# Patient Record
Sex: Female | Born: 1950 | Race: White | Hispanic: No | Marital: Married | Smoking: Never smoker
Health system: Southern US, Community
[De-identification: ages and names within clinical notes are randomized; demographics above are authoritative.]

## PROBLEM LIST (undated history)

## (undated) DIAGNOSIS — F32A Depression, unspecified: Secondary | ICD-10-CM

## (undated) DIAGNOSIS — R55 Syncope and collapse: Secondary | ICD-10-CM

## (undated) DIAGNOSIS — M1990 Unspecified osteoarthritis, unspecified site: Secondary | ICD-10-CM

## (undated) DIAGNOSIS — G35 Multiple sclerosis: Secondary | ICD-10-CM

## (undated) DIAGNOSIS — F329 Major depressive disorder, single episode, unspecified: Secondary | ICD-10-CM

## (undated) HISTORY — PX: BREAST LUMPECTOMY: SHX2

## (undated) HISTORY — PX: BREAST BIOPSY: SHX20

## (undated) HISTORY — PX: OTHER SURGICAL HISTORY: SHX169

## (undated) HISTORY — DX: Syncope and collapse: R55

## (undated) HISTORY — DX: Unspecified osteoarthritis, unspecified site: M19.90

## (undated) HISTORY — PX: BREAST EXCISIONAL BIOPSY: SUR124

## (undated) HISTORY — PX: BUNIONECTOMY: SHX129

## (undated) HISTORY — PX: TOTAL ABDOMINAL HYSTERECTOMY: SHX209

## (undated) HISTORY — DX: Depression, unspecified: F32.A

## (undated) HISTORY — DX: Major depressive disorder, single episode, unspecified: F32.9

## (undated) HISTORY — PX: REPLACEMENT TOTAL KNEE: SUR1224

## (undated) HISTORY — DX: Multiple sclerosis: G35

---

## 2016-11-21 DIAGNOSIS — G35 Multiple sclerosis: Secondary | ICD-10-CM | POA: Diagnosis not present

## 2016-11-21 DIAGNOSIS — Z1389 Encounter for screening for other disorder: Secondary | ICD-10-CM | POA: Diagnosis not present

## 2016-11-21 DIAGNOSIS — I499 Cardiac arrhythmia, unspecified: Secondary | ICD-10-CM | POA: Diagnosis not present

## 2016-11-21 DIAGNOSIS — I2510 Atherosclerotic heart disease of native coronary artery without angina pectoris: Secondary | ICD-10-CM | POA: Diagnosis not present

## 2016-11-21 DIAGNOSIS — F3289 Other specified depressive episodes: Secondary | ICD-10-CM | POA: Diagnosis not present

## 2016-11-21 DIAGNOSIS — M1990 Unspecified osteoarthritis, unspecified site: Secondary | ICD-10-CM | POA: Diagnosis not present

## 2016-11-21 DIAGNOSIS — F5104 Psychophysiologic insomnia: Secondary | ICD-10-CM | POA: Diagnosis not present

## 2016-11-21 DIAGNOSIS — Z6822 Body mass index (BMI) 22.0-22.9, adult: Secondary | ICD-10-CM | POA: Diagnosis not present

## 2016-11-21 DIAGNOSIS — M779 Enthesopathy, unspecified: Secondary | ICD-10-CM | POA: Diagnosis not present

## 2017-01-19 DIAGNOSIS — M7701 Medial epicondylitis, right elbow: Secondary | ICD-10-CM | POA: Diagnosis not present

## 2017-01-19 DIAGNOSIS — M7711 Lateral epicondylitis, right elbow: Secondary | ICD-10-CM | POA: Diagnosis not present

## 2017-01-19 DIAGNOSIS — M7702 Medial epicondylitis, left elbow: Secondary | ICD-10-CM | POA: Diagnosis not present

## 2017-03-16 DIAGNOSIS — M25561 Pain in right knee: Secondary | ICD-10-CM | POA: Diagnosis not present

## 2017-03-16 DIAGNOSIS — M25562 Pain in left knee: Secondary | ICD-10-CM | POA: Diagnosis not present

## 2017-03-16 DIAGNOSIS — Z6823 Body mass index (BMI) 23.0-23.9, adult: Secondary | ICD-10-CM | POA: Diagnosis not present

## 2017-03-16 DIAGNOSIS — M545 Low back pain: Secondary | ICD-10-CM | POA: Diagnosis not present

## 2017-03-31 DIAGNOSIS — M542 Cervicalgia: Secondary | ICD-10-CM | POA: Diagnosis not present

## 2017-03-31 DIAGNOSIS — M50121 Cervical disc disorder at C4-C5 level with radiculopathy: Secondary | ICD-10-CM | POA: Diagnosis not present

## 2017-03-31 DIAGNOSIS — M545 Low back pain: Secondary | ICD-10-CM | POA: Diagnosis not present

## 2017-03-31 DIAGNOSIS — M5031 Other cervical disc degeneration,  high cervical region: Secondary | ICD-10-CM | POA: Diagnosis not present

## 2017-04-04 DIAGNOSIS — H35342 Macular cyst, hole, or pseudohole, left eye: Secondary | ICD-10-CM | POA: Diagnosis not present

## 2017-04-04 DIAGNOSIS — H35372 Puckering of macula, left eye: Secondary | ICD-10-CM | POA: Diagnosis not present

## 2017-04-04 DIAGNOSIS — H33193 Other retinoschisis and retinal cysts, bilateral: Secondary | ICD-10-CM | POA: Diagnosis not present

## 2017-04-04 DIAGNOSIS — H3581 Retinal edema: Secondary | ICD-10-CM | POA: Diagnosis not present

## 2017-04-07 DIAGNOSIS — H524 Presbyopia: Secondary | ICD-10-CM | POA: Diagnosis not present

## 2017-04-07 DIAGNOSIS — H469 Unspecified optic neuritis: Secondary | ICD-10-CM | POA: Diagnosis not present

## 2017-04-07 DIAGNOSIS — H2513 Age-related nuclear cataract, bilateral: Secondary | ICD-10-CM | POA: Diagnosis not present

## 2017-04-07 DIAGNOSIS — H33199 Other retinoschisis and retinal cysts, unspecified eye: Secondary | ICD-10-CM | POA: Diagnosis not present

## 2017-04-07 DIAGNOSIS — H35372 Puckering of macula, left eye: Secondary | ICD-10-CM | POA: Diagnosis not present

## 2017-04-07 DIAGNOSIS — H35342 Macular cyst, hole, or pseudohole, left eye: Secondary | ICD-10-CM | POA: Diagnosis not present

## 2017-04-12 DIAGNOSIS — M5031 Other cervical disc degeneration,  high cervical region: Secondary | ICD-10-CM | POA: Diagnosis not present

## 2017-04-12 DIAGNOSIS — M5136 Other intervertebral disc degeneration, lumbar region: Secondary | ICD-10-CM | POA: Diagnosis not present

## 2017-04-18 DIAGNOSIS — B348 Other viral infections of unspecified site: Secondary | ICD-10-CM | POA: Diagnosis not present

## 2017-04-18 DIAGNOSIS — Z6823 Body mass index (BMI) 23.0-23.9, adult: Secondary | ICD-10-CM | POA: Diagnosis not present

## 2017-04-18 DIAGNOSIS — R05 Cough: Secondary | ICD-10-CM | POA: Diagnosis not present

## 2017-04-19 DIAGNOSIS — H2511 Age-related nuclear cataract, right eye: Secondary | ICD-10-CM | POA: Diagnosis not present

## 2017-04-19 DIAGNOSIS — H2512 Age-related nuclear cataract, left eye: Secondary | ICD-10-CM | POA: Diagnosis not present

## 2017-04-26 DIAGNOSIS — M5031 Other cervical disc degeneration,  high cervical region: Secondary | ICD-10-CM | POA: Diagnosis not present

## 2017-05-04 DIAGNOSIS — M7712 Lateral epicondylitis, left elbow: Secondary | ICD-10-CM | POA: Diagnosis not present

## 2017-05-23 HISTORY — PX: BUNIONECTOMY: SHX129

## 2017-05-24 DIAGNOSIS — E7849 Other hyperlipidemia: Secondary | ICD-10-CM | POA: Diagnosis not present

## 2017-05-24 DIAGNOSIS — I2510 Atherosclerotic heart disease of native coronary artery without angina pectoris: Secondary | ICD-10-CM | POA: Diagnosis not present

## 2017-05-24 DIAGNOSIS — Z1389 Encounter for screening for other disorder: Secondary | ICD-10-CM | POA: Diagnosis not present

## 2017-05-24 DIAGNOSIS — Z6824 Body mass index (BMI) 24.0-24.9, adult: Secondary | ICD-10-CM | POA: Diagnosis not present

## 2017-05-24 DIAGNOSIS — F3289 Other specified depressive episodes: Secondary | ICD-10-CM | POA: Diagnosis not present

## 2017-05-24 DIAGNOSIS — N183 Chronic kidney disease, stage 3 (moderate): Secondary | ICD-10-CM | POA: Diagnosis not present

## 2017-05-24 DIAGNOSIS — M7711 Lateral epicondylitis, right elbow: Secondary | ICD-10-CM | POA: Diagnosis not present

## 2017-05-24 DIAGNOSIS — F5104 Psychophysiologic insomnia: Secondary | ICD-10-CM | POA: Diagnosis not present

## 2017-05-24 DIAGNOSIS — G35 Multiple sclerosis: Secondary | ICD-10-CM | POA: Diagnosis not present

## 2017-05-30 DIAGNOSIS — H2512 Age-related nuclear cataract, left eye: Secondary | ICD-10-CM | POA: Diagnosis not present

## 2017-05-30 DIAGNOSIS — H25812 Combined forms of age-related cataract, left eye: Secondary | ICD-10-CM | POA: Diagnosis not present

## 2017-07-18 DIAGNOSIS — H35372 Puckering of macula, left eye: Secondary | ICD-10-CM | POA: Diagnosis not present

## 2017-07-18 DIAGNOSIS — H3581 Retinal edema: Secondary | ICD-10-CM | POA: Diagnosis not present

## 2017-07-18 DIAGNOSIS — H33193 Other retinoschisis and retinal cysts, bilateral: Secondary | ICD-10-CM | POA: Diagnosis not present

## 2017-07-18 DIAGNOSIS — H35342 Macular cyst, hole, or pseudohole, left eye: Secondary | ICD-10-CM | POA: Diagnosis not present

## 2017-07-31 DIAGNOSIS — H26492 Other secondary cataract, left eye: Secondary | ICD-10-CM | POA: Diagnosis not present

## 2017-07-31 DIAGNOSIS — H3581 Retinal edema: Secondary | ICD-10-CM | POA: Diagnosis not present

## 2017-07-31 DIAGNOSIS — H35372 Puckering of macula, left eye: Secondary | ICD-10-CM | POA: Diagnosis not present

## 2017-07-31 DIAGNOSIS — H35342 Macular cyst, hole, or pseudohole, left eye: Secondary | ICD-10-CM | POA: Diagnosis not present

## 2017-08-01 DIAGNOSIS — H35372 Puckering of macula, left eye: Secondary | ICD-10-CM | POA: Diagnosis not present

## 2017-08-07 DIAGNOSIS — H35372 Puckering of macula, left eye: Secondary | ICD-10-CM | POA: Diagnosis not present

## 2017-08-07 DIAGNOSIS — H3581 Retinal edema: Secondary | ICD-10-CM | POA: Diagnosis not present

## 2017-08-28 DIAGNOSIS — H35342 Macular cyst, hole, or pseudohole, left eye: Secondary | ICD-10-CM | POA: Diagnosis not present

## 2017-08-28 DIAGNOSIS — H35372 Puckering of macula, left eye: Secondary | ICD-10-CM | POA: Diagnosis not present

## 2017-09-26 DIAGNOSIS — H35372 Puckering of macula, left eye: Secondary | ICD-10-CM | POA: Diagnosis not present

## 2017-11-20 DIAGNOSIS — E559 Vitamin D deficiency, unspecified: Secondary | ICD-10-CM | POA: Diagnosis not present

## 2017-11-20 DIAGNOSIS — N183 Chronic kidney disease, stage 3 (moderate): Secondary | ICD-10-CM | POA: Diagnosis not present

## 2017-11-20 DIAGNOSIS — E7849 Other hyperlipidemia: Secondary | ICD-10-CM | POA: Diagnosis not present

## 2017-11-27 DIAGNOSIS — I2510 Atherosclerotic heart disease of native coronary artery without angina pectoris: Secondary | ICD-10-CM | POA: Diagnosis not present

## 2017-11-27 DIAGNOSIS — G35 Multiple sclerosis: Secondary | ICD-10-CM | POA: Diagnosis not present

## 2017-11-27 DIAGNOSIS — E559 Vitamin D deficiency, unspecified: Secondary | ICD-10-CM | POA: Diagnosis not present

## 2017-11-27 DIAGNOSIS — M1990 Unspecified osteoarthritis, unspecified site: Secondary | ICD-10-CM | POA: Diagnosis not present

## 2017-11-27 DIAGNOSIS — E7849 Other hyperlipidemia: Secondary | ICD-10-CM | POA: Diagnosis not present

## 2017-11-27 DIAGNOSIS — R61 Generalized hyperhidrosis: Secondary | ICD-10-CM | POA: Diagnosis not present

## 2017-11-27 DIAGNOSIS — F3289 Other specified depressive episodes: Secondary | ICD-10-CM | POA: Diagnosis not present

## 2017-11-27 DIAGNOSIS — N183 Chronic kidney disease, stage 3 (moderate): Secondary | ICD-10-CM | POA: Diagnosis not present

## 2017-11-27 DIAGNOSIS — Z0000 Encounter for general adult medical examination without abnormal findings: Secondary | ICD-10-CM | POA: Diagnosis not present

## 2017-11-29 DIAGNOSIS — H43811 Vitreous degeneration, right eye: Secondary | ICD-10-CM | POA: Diagnosis not present

## 2017-11-29 DIAGNOSIS — H35342 Macular cyst, hole, or pseudohole, left eye: Secondary | ICD-10-CM | POA: Diagnosis not present

## 2017-11-29 DIAGNOSIS — H3581 Retinal edema: Secondary | ICD-10-CM | POA: Diagnosis not present

## 2017-11-29 DIAGNOSIS — H33193 Other retinoschisis and retinal cysts, bilateral: Secondary | ICD-10-CM | POA: Diagnosis not present

## 2018-02-14 DIAGNOSIS — M79672 Pain in left foot: Secondary | ICD-10-CM | POA: Diagnosis not present

## 2018-02-14 DIAGNOSIS — Z6822 Body mass index (BMI) 22.0-22.9, adult: Secondary | ICD-10-CM | POA: Diagnosis not present

## 2018-03-07 DIAGNOSIS — H33193 Other retinoschisis and retinal cysts, bilateral: Secondary | ICD-10-CM | POA: Diagnosis not present

## 2018-03-07 DIAGNOSIS — H43811 Vitreous degeneration, right eye: Secondary | ICD-10-CM | POA: Diagnosis not present

## 2018-03-07 DIAGNOSIS — H3581 Retinal edema: Secondary | ICD-10-CM | POA: Diagnosis not present

## 2018-03-15 ENCOUNTER — Encounter: Payer: Self-pay | Admitting: Podiatry

## 2018-03-15 ENCOUNTER — Ambulatory Visit (INDEPENDENT_AMBULATORY_CARE_PROVIDER_SITE_OTHER): Payer: Medicare HMO

## 2018-03-15 ENCOUNTER — Ambulatory Visit: Payer: Medicare HMO | Admitting: Podiatry

## 2018-03-15 DIAGNOSIS — M2011 Hallux valgus (acquired), right foot: Secondary | ICD-10-CM

## 2018-03-15 DIAGNOSIS — M205X2 Other deformities of toe(s) (acquired), left foot: Secondary | ICD-10-CM

## 2018-03-15 DIAGNOSIS — M7752 Other enthesopathy of left foot: Secondary | ICD-10-CM

## 2018-03-15 DIAGNOSIS — M2012 Hallux valgus (acquired), left foot: Secondary | ICD-10-CM

## 2018-03-15 DIAGNOSIS — M2010 Hallux valgus (acquired), unspecified foot: Secondary | ICD-10-CM

## 2018-03-15 NOTE — Progress Notes (Signed)
Subjective:    Patient ID: Jenan Ellegood, female    DOB: January 12, 1951, 67 y.o.   MRN: [id number]  HPI 67 year old female presents the office today for concerns of pain in the left big toe joint which is been ongoing for the last several weeks.  She states that she is interested in 3 months ago in the last 6 weeks she is having increased pain and swelling to the big toe joint.  She states that it feels the same as it did previously on the right foot before she had surgery.  She had surgery about 7 years ago she had a hemiarthroplasty performed to the first MPJ.  She states that she has pain on daily basis she is tried changing shoes come off with a significant improvement.   Review of Systems  All other systems reviewed and are negative.  History reviewed. No pertinent past medical history.  History reviewed. No pertinent surgical history.   Current Outpatient Medications:  .  desvenlafaxine (PRISTIQ) 50 MG 24 hr tablet, TAKE ONE TABLE BY MOUTH DAILY, Disp: , Rfl: 5 .  estradiol (ESTRACE) 0.5 MG tablet, Take 0.5 mg by mouth daily., Disp: , Rfl: 2 .  MYRBETRIQ 50 MG TB24 tablet, Take 50 mg by mouth at bedtime., Disp: , Rfl: 2 .  Vitamin D, Ergocalciferol, (DRISDOL) 50000 units CAPS capsule, TAKE ONE (1) CAPSULE BY MOUTH ONCE (1X) A WEEK, Disp: , Rfl: 2  No Known Allergies       Objective:   Physical Exam  General: AAO x3, NAD  Dermatological: Skin is warm, dry and supple bilateral. Nails x 10 are well manicured; remaining integument appears unremarkable at this time. There are no open sores, no preulcerative lesions, no rash or signs of infection present.  Vascular: Dorsalis Pedis artery and Posterior Tibial artery pedal pulses are 2/4 bilateral with immedate capillary fill time. Pedal hair growth present. No varicosities and no lower extremity edema present bilateral. There is no pain with calf compression, swelling, warmth, erythema.   Neruologic: Grossly intact via light touch  bilateral. Protective threshold with Semmes Wienstein monofilament intact to all pedal sites bilateral.   Musculoskeletal: There is discomfort with MPJ range of motion of the first MPJ on the left side.  Minimal discomfort of the right foot on the first MPJ.  She states that the pain on the first MPJ in the left foot is painful like it was prior to surgery in the right side.  There is minimal edema of the first MPJ and there is no erythema increased warmth.  No other areas of tenderness identified.  Muscular strength 5/5 in all groups tested bilateral.  Gait: Unassisted, Nonantalgic.      Assessment & Plan:  67 year old female capsulitis, hallux limitus left first MPJ -Treatment options discussed including all alternatives, risks, and complications -Etiology of symptoms were discussed -X-rays were obtained and reviewed with the patient.  Mild arthritic changes present the first MPJ on the left side.  Previous hemiarthroplasty performed on the right first MPJ.  No evidence of acute fracture. -We long discussion today in regards to treatment options.  I discussed steroid injection, anti-inflammatories, shoe modifications, orthotics.  After discussed with conservative treatment she states that she tried all this previously in the right side before she did have surgery and she wants to go him proceed with surgery in the left side.  We discussed surgery was postoperative course this is not a guarantee resolution of symptoms.  She states that she  does not want a fusion performed.  We discussed Keller implant with hemiarthroplasty versus a Cartiva implant.  Discussed will make an intraoperative decision.  However she did well with any implant she is done well the last 7 years however she started to have pain on that side on the right as well.  Discussed after she has surgery in the left foot we will do an orthotic both of shoes to help offload the first MPJs. -We will plan for left foot Keller arthroplasty  with implant versus cheilectomy with Cartiva -The incision placement as well as the postoperative course was discussed with the patient. I discussed risks of the surgery which include, but not limited to, infection, bleeding, pain, swelling, need for further surgery, delayed or nonhealing, painful or ugly scar, numbness or sensation changes, over/under correction, recurrence, transfer lesions, further deformity, hardware failure, DVT/PE, loss of toe/foot. Patient understands these risks and wishes to proceed with surgery. The surgical consent was reviewed with the patient all 3 pages were signed. No promises or guarantees were given to the outcome of the procedure. All questions were answered to the best of my ability. Before the surgery the patient was encouraged to call the office if there is any further questions. The surgery will be performed at the [facility] [place] on an outpatient basis.  Vivi Barrack DPM

## 2018-03-15 NOTE — Patient Instructions (Signed)

## 2018-04-25 ENCOUNTER — Encounter: Payer: Self-pay | Admitting: Podiatry

## 2018-04-25 ENCOUNTER — Other Ambulatory Visit: Payer: Self-pay | Admitting: Podiatry

## 2018-04-25 DIAGNOSIS — M21612 Bunion of left foot: Secondary | ICD-10-CM | POA: Diagnosis not present

## 2018-04-25 DIAGNOSIS — M205X2 Other deformities of toe(s) (acquired), left foot: Secondary | ICD-10-CM | POA: Diagnosis not present

## 2018-04-25 DIAGNOSIS — M2012 Hallux valgus (acquired), left foot: Secondary | ICD-10-CM | POA: Diagnosis not present

## 2018-04-25 DIAGNOSIS — E7800 Pure hypercholesterolemia, unspecified: Secondary | ICD-10-CM | POA: Diagnosis not present

## 2018-04-25 DIAGNOSIS — M25572 Pain in left ankle and joints of left foot: Secondary | ICD-10-CM | POA: Diagnosis not present

## 2018-04-25 MED ORDER — OXYCODONE-ACETAMINOPHEN 5-325 MG PO TABS: 1.0000 | ORAL_TABLET | Freq: Four times a day (QID) | ORAL | 0 refills | Status: DC | PRN

## 2018-04-25 MED ORDER — PROMETHAZINE HCL 25 MG PO TABS: 25.0000 mg | ORAL_TABLET | Freq: Three times a day (TID) | ORAL | 0 refills | Status: DC | PRN

## 2018-04-25 MED ORDER — CEPHALEXIN 500 MG PO CAPS: 500.0000 mg | ORAL_CAPSULE | Freq: Three times a day (TID) | ORAL | 0 refills | Status: DC

## 2018-04-25 NOTE — Progress Notes (Signed)
Pre-operative Note  Patient presents to the [facility] today for surgical intervention of the left foot for keller bunionectomy with implant. The surgical consent was reviewed with the patient and we discussed the procedure as well as the postoperative course. I again discussed all alternatives, risks, complications. I answered all of their questions to the best of my ability and they wish to proceed with surgery. No promises or guarantees were given as to the outcome of the surgery.   The surgical consent was signed.   Patient is NPO since midnight.  The patient does not have have a history of blood clots or bleeding disorders.   Postop medications sent to her pharmacy.   No further questions.   Ovid Curd, DPM [facility]

## 2018-04-26 ENCOUNTER — Telehealth: Payer: Self-pay | Admitting: *Deleted

## 2018-04-26 NOTE — Telephone Encounter (Signed)
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 Misty Stanley

## 2018-04-30 ENCOUNTER — Ambulatory Visit (INDEPENDENT_AMBULATORY_CARE_PROVIDER_SITE_OTHER): Payer: Medicare HMO

## 2018-04-30 ENCOUNTER — Ambulatory Visit (INDEPENDENT_AMBULATORY_CARE_PROVIDER_SITE_OTHER): Payer: Medicare HMO | Admitting: Podiatry

## 2018-04-30 VITALS — Temp 98.0°F

## 2018-04-30 DIAGNOSIS — Z09 Encounter for follow-up examination after completed treatment for conditions other than malignant neoplasm: Secondary | ICD-10-CM

## 2018-04-30 DIAGNOSIS — M7752 Other enthesopathy of left foot: Secondary | ICD-10-CM | POA: Diagnosis not present

## 2018-04-30 DIAGNOSIS — M2010 Hallux valgus (acquired), unspecified foot: Secondary | ICD-10-CM

## 2018-04-30 DIAGNOSIS — M205X2 Other deformities of toe(s) (acquired), left foot: Secondary | ICD-10-CM

## 2018-05-03 NOTE — Progress Notes (Signed)
Subjective: Kelli Pratt is a 67 y.o. is seen today in office s/p left foot Keller bunionectomy with implant preformed on 04/25/2018.  She states that she is doing well she is not given any pain medication.  She has been doing a lot of volunteer work.  She has not taken any pain medicine.  Denies any systemic complaints such as fevers, chills, nausea, vomiting. No calf pain, chest pain, shortness of breath.   Objective: General: No acute distress, AAOx3  DP/PT pulses palpable 2/4, CRT < 3 sec to all digits.  Protective sensation intact. Motor function intact.  LEFT foot: Incision is well coapted without any evidence of dehiscence and sutures are intact. There is no surrounding erythema, ascending cellulitis, fluctuance, crepitus, malodor, drainage/purulence.  There is a blister present to the distal portion of the incision laterally on the toe.  The drain is only clear, blood fluid was expressed but there is no purulence.  There is mild edema around the surgical site. There is no pain along the surgical site.  There is no pain with MPJ range of motion No other areas of tenderness to bilateral lower extremities.  No other open lesions or pre-ulcerative lesions.  No pain with calf compression, swelling, warmth, erythema.   Assessment and Plan:  Status post left foot surgery, doing well with no complications   -Treatment options discussed including all alternatives, risks, and complications -X-rays were obtained again.  Status post Lorenz Coaster poignantly with implant.  No evidence of acute fracture. -Antibiotic ointment was applied followed by dry sterile dressing as directed during the blister.  I cleaned the skin with alcohol. -Continue cam boot at all times and limit activity.  Finish course of antibiotics. -Ice/elevation -Pain medication as needed. -Monitor for any clinical signs or symptoms of infection and DVT/PE and directed to call the office immediately should any occur or go to the  ER. -Follow-up 10 days or sooner if any problems arise. In the meantime, encouraged to call the office with any questions, concerns, change in symptoms.   Ovid Curd, DPM

## 2018-05-10 ENCOUNTER — Encounter: Payer: Medicare HMO | Admitting: Podiatry

## 2018-05-11 ENCOUNTER — Ambulatory Visit (INDEPENDENT_AMBULATORY_CARE_PROVIDER_SITE_OTHER): Payer: Medicare HMO | Admitting: Podiatry

## 2018-05-11 DIAGNOSIS — M205X2 Other deformities of toe(s) (acquired), left foot: Secondary | ICD-10-CM

## 2018-05-11 DIAGNOSIS — Z09 Encounter for follow-up examination after completed treatment for conditions other than malignant neoplasm: Secondary | ICD-10-CM

## 2018-05-13 DIAGNOSIS — M205X2 Other deformities of toe(s) (acquired), left foot: Secondary | ICD-10-CM | POA: Insufficient documentation

## 2018-05-13 NOTE — Progress Notes (Signed)
Subjective: Kelli Pratt is a 67 y.o. is seen today in office s/p left foot Keller bunionectomy with implant preformed on 04/25/2018.  Overall she states that she is doing well with minimal discomfort however she has got some throbbing at times and this all started after she did hit her toe.  She also states that she was at the sutures removed today and if we do not remove them she is going take them out herself.  She is remained in the surgical boot. Denies any systemic complaints such as fevers, chills, nausea, vomiting. No calf pain, chest pain, shortness of breath.   Objective: General: No acute distress, AAOx3  DP/PT pulses palpable 2/4, CRT < 3 sec to all digits.  Protective sensation intact. Motor function intact.  LEFT foot: Incision is well coapted without any evidence of dehiscence and sutures are intact.  The area that she had the blister on previously has scabbed over.  There is minimal edema to the area there is no erythema or increase in warmth there is no drainage or pus identified in the incision there is no clinical signs of infection.  She has great range of motion of her first MPJ there is no pain to palpitation or restriction with MPJ range of motion. No other areas of tenderness to bilateral lower extremities.  No other open lesions or pre-ulcerative lesions.  No pain with calf compression, swelling, warmth, erythema.   Assessment and Plan:  Status post left foot surgery, doing well with no complications   -Treatment options discussed including all alternatives, risks, and complications -Sutures removed without complications and Steri-Strips were applied for reinforcement.  I want her to hold off on getting the area wet for couple of days and then she starts to shower apply antibiotic ointment and a bandage.  Monitor if there is any issues of the incision not to get it wet. -Continue cam boot for now.  I want her to continue with some range of motion exercises for the first  MPJ. -Monitor for any clinical signs or symptoms of infection and DVT/PE and directed to call the office immediately should any occur or go to the ER. -Follow-up in 2 weeks or sooner if any problems arise. In the meantime, encouraged to call the office with any questions, concerns, change in symptoms.   Ovid Curd, DPM

## 2018-05-25 ENCOUNTER — Ambulatory Visit (INDEPENDENT_AMBULATORY_CARE_PROVIDER_SITE_OTHER): Payer: Medicare HMO | Admitting: Podiatry

## 2018-05-25 ENCOUNTER — Encounter: Payer: Self-pay | Admitting: Podiatry

## 2018-05-25 ENCOUNTER — Ambulatory Visit (INDEPENDENT_AMBULATORY_CARE_PROVIDER_SITE_OTHER): Payer: Medicare HMO

## 2018-05-25 DIAGNOSIS — Z9889 Other specified postprocedural states: Secondary | ICD-10-CM

## 2018-05-25 DIAGNOSIS — M205X2 Other deformities of toe(s) (acquired), left foot: Secondary | ICD-10-CM | POA: Diagnosis not present

## 2018-05-30 DIAGNOSIS — G35 Multiple sclerosis: Secondary | ICD-10-CM | POA: Diagnosis not present

## 2018-05-30 DIAGNOSIS — R110 Nausea: Secondary | ICD-10-CM | POA: Diagnosis not present

## 2018-05-30 DIAGNOSIS — E559 Vitamin D deficiency, unspecified: Secondary | ICD-10-CM | POA: Diagnosis not present

## 2018-05-30 DIAGNOSIS — H3530 Unspecified macular degeneration: Secondary | ICD-10-CM | POA: Diagnosis not present

## 2018-05-30 DIAGNOSIS — I2510 Atherosclerotic heart disease of native coronary artery without angina pectoris: Secondary | ICD-10-CM | POA: Diagnosis not present

## 2018-05-30 DIAGNOSIS — R251 Tremor, unspecified: Secondary | ICD-10-CM | POA: Diagnosis not present

## 2018-05-30 DIAGNOSIS — N183 Chronic kidney disease, stage 3 (moderate): Secondary | ICD-10-CM | POA: Diagnosis not present

## 2018-05-30 DIAGNOSIS — E7849 Other hyperlipidemia: Secondary | ICD-10-CM | POA: Diagnosis not present

## 2018-05-30 DIAGNOSIS — F329 Major depressive disorder, single episode, unspecified: Secondary | ICD-10-CM | POA: Diagnosis not present

## 2018-05-30 NOTE — Progress Notes (Signed)
Subjective: Kelli Pratt is a 68 y.o. is seen today in office s/p left foot Keller bunionectomy with implant preformed on 04/25/2018.  She states that she is doing well.  She has been in the surgical shoe.  She has been doing some range of motion exercise this and she is able to do this but any issues.  She actually forgot to wear the surgical shoe when she stood up on her tiptoes and bent it quite a bit but she had no pain when doing this.  She is ready get back into a shoe.  She has no other concerns today. Denies any systemic complaints such as fevers, chills, nausea, vomiting. No calf pain, chest pain, shortness of breath.   Objective: General: No acute distress, AAOx3  DP/PT pulses palpable 2/4, CRT < 3 sec to all digits.  Protective sensation intact. Motor function intact.  LEFT foot: Incision is well coapted without any evidence of dehiscence and a scar is formed.  There is minimal edema to the area there is no erythema or increase in warmth.  Incision appears to be healing well with any signs of dehiscence or infection.  There is no pain with MPJ range of motion. No other areas of tenderness to bilateral lower extremities.  No other open lesions or pre-ulcerative lesions.  No pain with calf compression, swelling, warmth, erythema.   Assessment and Plan:  Status post left foot surgery, doing well with no complications   -Treatment options discussed including all alternatives, risks, and complications -X-rays were obtained reviewed.  Hardware intact without any loosening. -Order to start to transition slowly back into regular shoe.  Continue with compression anklet was dispensed today.  Continue ice elevate as well.  Continue range of motion exercises. -Monitor for any clinical signs or symptoms of infection and directed to call the office immediately should any occur or go to the ER.  Return in about 3 weeks (around 06/15/2018).  Vivi Barrack DPM

## 2018-06-06 DIAGNOSIS — H33193 Other retinoschisis and retinal cysts, bilateral: Secondary | ICD-10-CM | POA: Diagnosis not present

## 2018-06-06 DIAGNOSIS — H3581 Retinal edema: Secondary | ICD-10-CM | POA: Diagnosis not present

## 2018-06-06 DIAGNOSIS — H43811 Vitreous degeneration, right eye: Secondary | ICD-10-CM | POA: Diagnosis not present

## 2018-06-15 ENCOUNTER — Ambulatory Visit (INDEPENDENT_AMBULATORY_CARE_PROVIDER_SITE_OTHER): Payer: Medicare HMO | Admitting: Podiatry

## 2018-06-15 ENCOUNTER — Encounter: Payer: Self-pay | Admitting: Podiatry

## 2018-06-15 DIAGNOSIS — Z9889 Other specified postprocedural states: Secondary | ICD-10-CM

## 2018-06-15 DIAGNOSIS — B351 Tinea unguium: Secondary | ICD-10-CM

## 2018-06-15 DIAGNOSIS — M205X2 Other deformities of toe(s) (acquired), left foot: Secondary | ICD-10-CM

## 2018-06-19 DIAGNOSIS — B351 Tinea unguium: Secondary | ICD-10-CM | POA: Insufficient documentation

## 2018-06-19 NOTE — Progress Notes (Signed)
Subjective: Kelli Pratt is a 68 y.o. is seen today in office s/p left foot Keller bunionectomy with implant preformed on 04/25/2018. Sh has bene wearing a regular shoe. She states that she is doing well with only minimal occasional pain. She has continued with ROM exercises. The incision is well healed. She is interested in laser for nail fungus. Denies any systemic complaints such as fevers, chills, nausea, vomiting. No calf pain, chest pain, shortness of breath.   Objective: General: No acute distress, AAOx3  DP/PT pulses palpable 2/4, CRT < 3 sec to all digits.  Protective sensation intact. Motor function intact.  LEFT foot: Incision is well coapted without any evidence of dehiscence and a scar is formed.  There is trace edema to the area there is no erythema or increase in warmth.  The incision is well healed. I am not able to elicit any tenderness to the surgical site or with ROM of the 1st MTPJ. She has great ROM of the 1st MTPJ without any crepitance. No other areas of tenderness to bilateral lower extremities.  The hallux toenail is thick and discolored without any pain, drainage, signs of infection.  No other open lesions or pre-ulcerative lesions.  No pain with calf compression, swelling, warmth, erythema.   Assessment and Plan:  Status post left foot surgery, doing well with no complications; onychomycosis.   -Treatment options discussed including all alternatives, risks, and complications -X-rays were obtained reviewed.  Hardware intact without any loosening. No evidence of acute fracture.  -She is doing well. Continue with ROM exercises and regular shoes. She gets some intermittent swelling. Continue with compression anklet -Discussed laser for nail fungus. She will consider it. Discussed this is not a guarantee and would recommend adding at least a topical in combination with laser.    Vivi Barrack DPM

## 2018-06-27 ENCOUNTER — Ambulatory Visit: Payer: Medicare HMO | Admitting: Internal Medicine

## 2018-06-27 ENCOUNTER — Encounter: Payer: Self-pay | Admitting: Internal Medicine

## 2018-06-27 VITALS — BP 126/84 | HR 90 | Ht 66.0 in | Wt 142.0 lb

## 2018-06-27 DIAGNOSIS — R072 Precordial pain: Secondary | ICD-10-CM

## 2018-06-27 DIAGNOSIS — E782 Mixed hyperlipidemia: Secondary | ICD-10-CM

## 2018-06-27 DIAGNOSIS — R0602 Shortness of breath: Secondary | ICD-10-CM

## 2018-06-27 DIAGNOSIS — R079 Chest pain, unspecified: Secondary | ICD-10-CM | POA: Diagnosis not present

## 2018-06-27 MED ORDER — ASPIRIN EC 81 MG PO TBEC: 81.0000 mg | DELAYED_RELEASE_TABLET | Freq: Every day | ORAL | Status: AC

## 2018-06-27 MED ORDER — METOPROLOL TARTRATE 50 MG PO TABS: ORAL_TABLET | ORAL | 0 refills | Status: DC

## 2018-06-27 NOTE — Patient Instructions (Addendum)
Medication Instructions:  Your physician has recommended you make the following change in your medication:   START Aspirin 81mg  daily  REDUCE your Ibuprofen use  If you need a refill on your cardiac medications before your next appointment, please call your pharmacy.   Lab work: Your physician recommends that you return for lab work a couple of days prior to your Cor CT  If you have labs (blood work) drawn today and your tests are completely normal, you will receive your results only by: Marland Kitchen MyChart Message (if you have MyChart) OR . A paper copy in the mail If you have any lab test that is abnormal or we need to change your treatment, we will call you to review the results.  Testing/Procedures: Your physician has requested that you have cardiac CT. Cardiac computed tomography (CT) is a painless test that uses an x-ray machine to take clear, detailed pictures of your heart. For further information please visit [email or web address] Please follow instruction sheet as given.     Follow-Up: At [facility], you and your health needs are our priority.  As part of our continuing mission to provide you with exceptional heart care, we have created designated Provider Care Teams.  These Care Teams include your primary Cardiologist (physician) and Advanced Practice Providers (APPs -  Physician Assistants and Nurse Practitioners) who all work together to provide you with the care you need, when you need it.  You will need a follow up appointment after your Cardiac CT      Any Other Special Instructions Will Be Listed Below (If Applicable).   Please arrive at the [facility] main entrance of [facility] at TBD  (30-45 minutes prior to test start time)  [facility] [address] [place], [place] [address] [phone]  Proceed to the [facility] Radiology Department (First Floor).  Please follow these instructions carefully (unless otherwise directed):    On  the Night Before the Test: . Be sure to Drink plenty of water. . Do not consume any caffeinated/decaffeinated beverages or chocolate 12 hours prior to your test. . Do not take any antihistamines 12 hours prior to your test.  On the Day of the Test: . Drink plenty of water. Do not drink any water within one hour of the test. . Do not eat any food 4 hours prior to the test. . You may take your regular medications prior to the test.  . Take metoprolol (Lopressor) two hours prior to test.       After the Test: . Drink plenty of water. . After receiving IV contrast, you may experience a mild flushed feeling. This is normal. . On occasion, you may experience a mild rash up to 24 hours after the test. This is not dangerous. If this occurs, you can take Benadryl 25 mg and increase your fluid intake. . If you experience trouble breathing, this can be serious. If it is severe call 911 IMMEDIATELY. If it is mild, please call our office. . If you take any of these medications: Glipizide/Metformin, Avandament, Glucavance, please do not take 48 hours after completing test.

## 2018-06-27 NOTE — Progress Notes (Addendum)
LIPID CLINIC CONSULT NOTE  Chief Complaint:  Chest pain, dyspnea, elevated cholesterol  Primary Care Physician: Reynold Bowen, MD  Primary Cardiologist:  No primary care provider on file.  HPI:  D Kelli Pratt is a 68 y.o. female who is being seen today for the evaluation of chest pain, dyspnea, and elevated cholesterol at the request of Reynold Bowen, MD. This is a pleasant 68 year old female who is kindly referred by Dr. Milas Gain for evaluation of chest pain and progressive dyspnea.  In addition she has elevated cholesterol and is disinterested in statins due to her history of MS.  She reports she has had progressive dyspnea and chest pain over the past several weeks to months.  Her symptoms were significant this past Sunday and she almost went to the emergency department.  Finally her symptoms resolved.  She describes it as a central chest pressure with shortness of breath even with minimal exertion such as getting out of bed and going to the bathroom.  There is a strong family history of heart disease in her father who had an MI in his 63s or 65s and mother who had elevated cholesterol.  Personally she has a high cholesterol as well.  Her total cholesterol as of January showed total cholesterol 296, triglycerides 67, HDL 74, however LDL was 209.  Again she has not been on statin therapy before.  She does have MS but is not on treatment due to cost issues.  Other medical problems include osteoarthritis, depression and an episode of exercise related bradycardia and syncope approximately 10 years ago when she was in [place] visiting.  Apparently she had a heart catheterization which showed about a 30% blockage but no significant other findings.  She has had no further episodes like that.  PMHx:  Past Medical History:  Diagnosis Date  . Depression   . MS (multiple sclerosis) ([place])   . OA (osteoarthritis)   . Syncope and collapse     Past Surgical History:  Procedure Laterality Date  .  BUNIONECTOMY    . cataracts      FAMHx:  Family History  Problem Relation Age of Onset  . Multiple myeloma Mother   . Heart attack Father   . Dementia Father     SOCHx:   reports that she has never smoked. She has never used smokeless tobacco. She reports previous alcohol use. No history on file for drug.  ALLERGIES:  Allergies  Allergen Reactions  . Versed [Midazolam]     ROS: Pertinent items noted in HPI and remainder of comprehensive ROS otherwise negative.  HOME MEDS: Current Outpatient Medications on File Prior to Visit  Medication Sig Dispense Refill  . clonazePAM (KLONOPIN) 0.5 MG tablet Take 1 tablet by mouth 3 (three) times daily.    Marland Kitchen desvenlafaxine (PRISTIQ) 50 MG 24 hr tablet TAKE ONE TABLE BY MOUTH DAILY  5  . estradiol (ESTRACE) 0.5 MG tablet Take 0.5 mg by mouth daily.  2  . MYRBETRIQ 50 MG TB24 tablet Take 50 mg by mouth at bedtime.  2  . promethazine (PHENERGAN) 25 MG tablet Take 1 tablet (25 mg total) by mouth every 8 (eight) hours as needed for nausea or vomiting. 20 tablet 0  . Vitamin D, Ergocalciferol, (DRISDOL) 50000 units CAPS capsule TAKE ONE (1) CAPSULE BY MOUTH ONCE (1X) A WEEK  2   No current facility-administered medications on file prior to visit.     LABS/IMAGING: No results found for this or any previous visit (  from the past 48 hour(s)). No results found.  LIPID PANEL: No results found for: CHOL, TRIG, HDL, CHOLHDL, VLDL, LDLCALC, LDLDIRECT  WEIGHTS: Wt Readings from Last 3 Encounters:  06/27/18 142 lb (64.4 kg)    VITALS: BP 126/84   Pulse 90   Ht _0  (1.676 m)   Wt 142 lb (64.4 kg)   BMI 22.92 kg/m   EXAM: General appearance: alert and no distress Neck: no carotid bruit, no JVD and thyroid not enlarged, symmetric, no tenderness/mass/nodules Lungs: clear to auscultation bilaterally Heart: regular rate and rhythm, S1, S2 normal, no murmur, click, rub or gallop Abdomen: soft, non-tender; bowel sounds normal; no masses,  no  organomegaly Extremities: extremities normal, atraumatic, no cyanosis or edema Pulses: 2+ and symmetric Skin: Skin color, texture, turgor normal. No rashes or lesions or healing left foot surgical incision over the great toe Neurologic: Grossly normal Psych: Pleasant  EKG: Normal sinus rhythm 83, nonspecific T wave changes- personally reviewed  ASSESSMENT: 1. Progressive precordial chest pain and dyspnea, concerning for angina 2. Marked dyslipidemia 3. History of MS 4. Family history of premature coronary disease 5. History of mild nonobstructive coronary disease by cath greater than 10 years ago  PLAN: 1.   Kelli Pratt he is describing progressive chest pain and dyspnea which is worsened over the past several weeks.  She had heart cath greater than 10 years ago in [place] which reportedly showed only 30% stenosis.  She does have marked dyslipidemia and a family history of premature coronary disease but also has MS which is untreated and her dyslipidemia is untreated due to desire to avoid a statin.  Certainly this chest pain could be cardiac in origin.  I am recommending coronary artery CT scan to further evaluate for possible obstructive coronary disease.  In addition, I would consider statin therapy.  I understand her PCP is hesitant to use statin therapy because of her history of MS.  While is true this could worsen her MS symptoms, a number of patients are able to tolerate statins with MS and is unlikely that we will get approval for PCSK9 inhibitor without failing statin therapy first.  I would advise she start on aspirin 81 mg daily until we better know her coronary artery disease status.  She will have to discontinue or significantly decrease the use of concomitant ibuprofen for which she takes quite a bit on a daily basis due to osteoarthritis.  She is also noted to have a history of chronic kidney disease however her most recent creatinine was 0.8 and should not be an issue for CT  angiography.  Thanks again for the kind referral.  Follow-up with me afterwards.  Pixie Casino, MD, [facility], [place] Director of the Advanced Lipid Disorders &  Cardiovascular Risk Reduction Clinic Diplomate of the American Board of Clinical Lipidology Attending Cardiologist  Direct Dial: [phone]  Fax: [phone]  Website:  [email or web address]  Nadean Corwin  06/27/2018, 2:00 PM

## 2018-07-23 DIAGNOSIS — R0602 Shortness of breath: Secondary | ICD-10-CM | POA: Diagnosis not present

## 2018-07-24 ENCOUNTER — Telehealth (HOSPITAL_COMMUNITY): Payer: Self-pay | Admitting: Emergency Medicine

## 2018-07-24 LAB — BASIC METABOLIC PANEL
BUN/Creatinine Ratio: 18 (ref 12–28)
BUN: 16 mg/dL (ref 8–27)
CO2: 21 mmol/L (ref 20–29)
CREATININE: 0.9 mg/dL (ref 0.57–1.00)
Calcium: 9.9 mg/dL (ref 8.7–10.3)
Chloride: 107 mmol/L — ABNORMAL HIGH (ref 96–106)
GFR calc Af Amer: 76 mL/min/{1.73_m2} (ref >59)
GFR, EST NON AFRICAN AMERICAN: 66 mL/min/{1.73_m2} (ref >59)
Glucose: 93 mg/dL (ref 65–99)
Potassium: 5.2 mmol/L (ref 3.5–5.2)
Sodium: 143 mmol/L (ref 134–144)

## 2018-07-24 NOTE — Telephone Encounter (Signed)
Left message on voicemail with name and callback number Endy Easterly RN Navigator Cardiac Imaging [facility] [phone] Office [phone] Cell  

## 2018-07-26 ENCOUNTER — Ambulatory Visit (HOSPITAL_COMMUNITY)
Admission: RE | Admit: 2018-07-26 | Discharge: 2018-07-26 | Disposition: A | Discharge status: Home / Self Care | Payer: Medicare HMO | Source: Ambulatory Visit | Attending: Internal Medicine | Admitting: Internal Medicine

## 2018-07-26 DIAGNOSIS — R072 Precordial pain: Secondary | ICD-10-CM | POA: Diagnosis not present

## 2018-07-26 DIAGNOSIS — R0602 Shortness of breath: Secondary | ICD-10-CM | POA: Insufficient documentation

## 2018-07-26 DIAGNOSIS — R079 Chest pain, unspecified: Secondary | ICD-10-CM

## 2018-07-26 MED ORDER — IOHEXOL 350 MG/ML SOLN
80.0000 mL | Freq: Once | INTRAVENOUS | Status: AC | PRN
  Administered 2018-07-26: 80 mL via INTRAVENOUS

## 2018-07-26 MED ORDER — NITROGLYCERIN 0.4 MG SL SUBL
0.8000 mg | SUBLINGUAL_TABLET | Freq: Once | SUBLINGUAL | Status: AC
  Administered 2018-07-26: 0.8 mg via SUBLINGUAL
  Filled 2018-07-26: qty 25

## 2018-07-26 MED ORDER — NITROGLYCERIN 0.4 MG SL SUBL
SUBLINGUAL_TABLET | SUBLINGUAL | Status: AC
  Filled 2018-07-26: qty 2

## 2018-07-27 ENCOUNTER — Other Ambulatory Visit: Payer: Self-pay | Admitting: Podiatry

## 2018-07-27 ENCOUNTER — Ambulatory Visit (INDEPENDENT_AMBULATORY_CARE_PROVIDER_SITE_OTHER): Payer: Medicare HMO

## 2018-07-27 ENCOUNTER — Ambulatory Visit: Payer: Medicare HMO | Admitting: Podiatry

## 2018-07-27 DIAGNOSIS — M205X2 Other deformities of toe(s) (acquired), left foot: Secondary | ICD-10-CM

## 2018-07-27 DIAGNOSIS — M79672 Pain in left foot: Secondary | ICD-10-CM

## 2018-07-27 DIAGNOSIS — B351 Tinea unguium: Secondary | ICD-10-CM | POA: Diagnosis not present

## 2018-07-30 NOTE — Progress Notes (Signed)
Subjective: Kelli Pratt is a 68 y.o. is seen today in office s/p left foot Keller bunionectomy with implant preformed on 04/25/2018.  She says that overall she has been doing well.  She still has some intermittent swelling and discomfort but overall is getting better.  She did state that she did injure her toe when she bent her toe back quite far the other week and it was sore then but her symptoms have been improved since then.  She has no concerns from a surgical standpoint today.  She is interested in toenail fungus treatment given that both of her big toenails are thickened discolored.  Denies any pain in the nails currently denies any redness or drainage or any swelling.  Denies any systemic complaints such as fevers, chills, nausea, vomiting. No calf pain, chest pain, shortness of breath.   Objective: General: No acute distress, AAOx3  DP/PT pulses palpable 2/4, CRT < 3 sec to all digits.  Protective sensation intact. Motor function intact.  LEFT foot: Incision is well coapted without any evidence of dehiscence and a scar is formed.  Minimal swelling to the surgical site today.  There is no pain or restriction or crepitation with MPJ range of motion. Bilateral hallux nails are hypertrophic, dystrophic with yellow-brown discoloration there loosely underlying nail distally.  No pain in the nails there is no surrounding redness or drainage or any clinical signs of infection. No other open lesions or pre-ulcerative lesions.  No pain with calf compression, swelling, warmth, erythema.   Assessment and Plan:  Status post left foot surgery, doing well with no complications; onychomycosis.   -Treatment options discussed including all alternatives, risks, and complications -X-rays were obtained reviewed.  Hardware intact without any loosening. No evidence of acute fracture.  -From a surgical standpoint she is doing well.  Continue with supportive shoes and continue range of motion exercises  although she has excellent range of motion.  Her swelling she does report is improving we will continue to observe. -Regards to her toenails we previously discussed laser therapy and other options however before proceeding to the treatment I did debride the nail specimen for culture.   Vivi Barrack DPM

## 2018-07-31 NOTE — Addendum Note (Signed)
Addended by: Hadley Pen R on: 07/31/2018 08:19 AM   Modules accepted: Orders

## 2018-08-17 ENCOUNTER — Telehealth: Payer: Self-pay | Admitting: *Deleted

## 2018-08-17 MED ORDER — NONFORMULARY OR COMPOUNDED ITEM: 5 refills | Status: DC

## 2018-08-17 NOTE — Telephone Encounter (Signed)
-----   Message from Vivi Barrack, DPM sent at 08/17/2018  6:57 AM EDT ----- Val- please let her know that the culture did show fungus, it was a yeast form. We had discussed laser. It is still an option but does not work as well for this type of fungus. If she would like to do laser I would at least combine it with the topical through [place] for the nail fungus ointment that includes itraconazole. Oral medication is the best for this type but I think she wanted to hold off on oral medications. Thanks.

## 2018-08-17 NOTE — Telephone Encounter (Signed)
I informed pt of Dr. Gabriel Rung review of results and recommendations. Pt states she will wait on the laser and use the topical. I informed pt the topical came from [place], they would call with coverage and delivery information. faxed orders to [facility].

## 2018-11-08 ENCOUNTER — Ambulatory Visit: Payer: Medicare HMO | Admitting: Podiatry

## 2018-11-08 ENCOUNTER — Encounter: Payer: Self-pay | Admitting: Podiatry

## 2018-11-08 ENCOUNTER — Ambulatory Visit (INDEPENDENT_AMBULATORY_CARE_PROVIDER_SITE_OTHER): Payer: Medicare HMO

## 2018-11-08 ENCOUNTER — Other Ambulatory Visit: Payer: Self-pay

## 2018-11-08 DIAGNOSIS — M779 Enthesopathy, unspecified: Secondary | ICD-10-CM | POA: Diagnosis not present

## 2018-11-08 DIAGNOSIS — M7752 Other enthesopathy of left foot: Secondary | ICD-10-CM | POA: Diagnosis not present

## 2018-11-08 DIAGNOSIS — M778 Other enthesopathies, not elsewhere classified: Secondary | ICD-10-CM

## 2018-11-08 DIAGNOSIS — S93622A Sprain of tarsometatarsal ligament of left foot, initial encounter: Secondary | ICD-10-CM | POA: Diagnosis not present

## 2018-11-08 MED ORDER — MELOXICAM 15 MG PO TABS: 15.0000 mg | ORAL_TABLET | Freq: Every day | ORAL | 0 refills | Status: DC

## 2018-11-08 NOTE — Progress Notes (Signed)
Subjective: 68 year old female presents the office with concerns of left foot pain the top of her foot.  She states that she was walking and she felt something pop and is still been painful when she puts weight on it.  This happened about a month ago.  She states it is not getting any better.  Minimal swelling.  No redness or warmth. Denies any systemic complaints such as fevers, chills, nausea, vomiting. No acute changes since last appointment, and no other complaints at this time.   Objective: AAO x3, NAD DP/PT pulses palpable bilaterally, CRT less than 3 seconds Scar from prior surgery is well-healed.  There is no pain with MPJ range of motion crepitation.  There is no pain with MPJ range of motion today for the majority of tenderness along the dorsal aspect of the midfoot on the Lisfranc complex.  No interrogatories incision.  No significant edema I can appreciate today.  No pain with ankle.  Achilles tendon appears intact.  Plantar fascia intact.  No pain with calf compression, swelling, warmth, erythema  Assessment: Concern for Lisfranc injury left  Plan: -All treatment options discussed with the patient including all alternatives, risks, complications.  -X-rays were obtained and reviewed with the patient. There is mild widening of the 1st and 2nd metatarsal but this was also present on prior x-rays.  -Recommend going back into the CAM boot. Ice/elevation -Prescribed mobic. Discussed side effects of the medication and directed to stop if any are to occur and call the office.  -If no improvement then will get an MRI -Patient encouraged to call the office with any questions, concerns, change in symptoms.   Trula Slade DPM

## 2018-11-22 DIAGNOSIS — F329 Major depressive disorder, single episode, unspecified: Secondary | ICD-10-CM | POA: Diagnosis not present

## 2018-11-22 DIAGNOSIS — E7849 Other hyperlipidemia: Secondary | ICD-10-CM | POA: Diagnosis not present

## 2018-11-26 DIAGNOSIS — E7849 Other hyperlipidemia: Secondary | ICD-10-CM | POA: Diagnosis not present

## 2018-11-30 DIAGNOSIS — R32 Unspecified urinary incontinence: Secondary | ICD-10-CM | POA: Diagnosis not present

## 2018-11-30 DIAGNOSIS — G35 Multiple sclerosis: Secondary | ICD-10-CM | POA: Diagnosis not present

## 2018-11-30 DIAGNOSIS — M79672 Pain in left foot: Secondary | ICD-10-CM | POA: Diagnosis not present

## 2018-11-30 DIAGNOSIS — N183 Chronic kidney disease, stage 3 (moderate): Secondary | ICD-10-CM | POA: Diagnosis not present

## 2018-11-30 DIAGNOSIS — H3530 Unspecified macular degeneration: Secondary | ICD-10-CM | POA: Diagnosis not present

## 2018-11-30 DIAGNOSIS — I2510 Atherosclerotic heart disease of native coronary artery without angina pectoris: Secondary | ICD-10-CM | POA: Diagnosis not present

## 2018-11-30 DIAGNOSIS — Z1339 Encounter for screening examination for other mental health and behavioral disorders: Secondary | ICD-10-CM | POA: Diagnosis not present

## 2018-11-30 DIAGNOSIS — E785 Hyperlipidemia, unspecified: Secondary | ICD-10-CM | POA: Diagnosis not present

## 2018-11-30 DIAGNOSIS — E559 Vitamin D deficiency, unspecified: Secondary | ICD-10-CM | POA: Diagnosis not present

## 2018-11-30 DIAGNOSIS — Z1331 Encounter for screening for depression: Secondary | ICD-10-CM | POA: Diagnosis not present

## 2018-11-30 DIAGNOSIS — Z0000 Encounter for general adult medical examination without abnormal findings: Secondary | ICD-10-CM | POA: Diagnosis not present

## 2018-12-03 ENCOUNTER — Other Ambulatory Visit: Payer: Self-pay | Admitting: Podiatry

## 2018-12-06 ENCOUNTER — Other Ambulatory Visit: Payer: Self-pay

## 2018-12-06 ENCOUNTER — Ambulatory Visit: Payer: Medicare HMO | Admitting: Podiatry

## 2018-12-06 ENCOUNTER — Ambulatory Visit (INDEPENDENT_AMBULATORY_CARE_PROVIDER_SITE_OTHER): Payer: Medicare HMO

## 2018-12-06 DIAGNOSIS — M779 Enthesopathy, unspecified: Secondary | ICD-10-CM | POA: Diagnosis not present

## 2018-12-06 DIAGNOSIS — S93622A Sprain of tarsometatarsal ligament of left foot, initial encounter: Secondary | ICD-10-CM | POA: Diagnosis not present

## 2018-12-06 NOTE — Progress Notes (Signed)
Subjective: 68 year old female presents the office today for evaluation left foot pain.  She presents today wearing regular shoe.  She states that she did try to wear the cam boot but she states it did nothing for her pain.  She states her pain is about 3/10.  She states that at times it hurts going up and down steps.  She states that she gets some discomfort at times however it is more severe at certain times.  The increased pain does not seem to correlate with specific activities. Denies any systemic complaints such as fevers, chills, nausea, vomiting. No acute changes since last appointment, and no other complaints at this time.   Objective: AAO x3, NAD DP/PT pulses palpable bilaterally, CRT less than 3 seconds There is still tenderness to palpation on the dorsal aspect of foot.  There is mild discomfort along the second third metatarsal cuneiform joints with majority of tenderness is along the second interspace just distal to the Lisfranc joint.  No edema, erythema to the foot.  No pain in the ankle or forefoot.  There is still some discomfort of the first MPJ.  She states the pain is intermittent. No open lesions or pre-ulcerative lesions.  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  -Repeat x-rays were obtained.  Skin marker was utilized identify the area of maximal tenderness.  There is no evidence of acute fracture.  No significant widening of Lisfranc complex. -Steroid injection performed today.  Maximal tenderness on the interspace.  Mixture 1 cc dexamethasone phosphate and 0.5 cc of Marcaine plain with 0.5 cc of lidocaine plain was infiltrated without complications.  Postinjection care discussed.  Discussed wearing a rigid bottom shoe. -If she is not seeing any better next 2 weeks  let me know to order an MRI. -Patient encouraged to call the office with any questions, concerns, change in symptoms.   Trula Slade DPM

## 2018-12-17 ENCOUNTER — Telehealth: Payer: Self-pay | Admitting: Internal Medicine

## 2018-12-17 ENCOUNTER — Encounter: Payer: Self-pay | Admitting: Internal Medicine

## 2018-12-17 ENCOUNTER — Ambulatory Visit (INDEPENDENT_AMBULATORY_CARE_PROVIDER_SITE_OTHER): Payer: Medicare HMO | Admitting: Internal Medicine

## 2018-12-17 ENCOUNTER — Other Ambulatory Visit: Payer: Self-pay

## 2018-12-17 VITALS — BP 116/82 | HR 78 | Temp 97.3°F | Ht 66.0 in | Wt 129.0 lb

## 2018-12-17 DIAGNOSIS — R931 Abnormal findings on diagnostic imaging of heart and coronary circulation: Secondary | ICD-10-CM | POA: Diagnosis not present

## 2018-12-17 DIAGNOSIS — I2510 Atherosclerotic heart disease of native coronary artery without angina pectoris: Secondary | ICD-10-CM | POA: Diagnosis not present

## 2018-12-17 DIAGNOSIS — R072 Precordial pain: Secondary | ICD-10-CM | POA: Diagnosis not present

## 2018-12-17 DIAGNOSIS — E782 Mixed hyperlipidemia: Secondary | ICD-10-CM

## 2018-12-17 NOTE — Progress Notes (Signed)
LIPID CLINIC CONSULT NOTE  Chief Complaint:  Chest pain, dyspnea, elevated cholesterol  Primary Care Physician: Reynold Bowen, MD  Primary Cardiologist:  No primary care provider on file.  HPI:  Kelli Pratt is a 68 y.o. female who is being seen today for the evaluation of chest pain, dyspnea, and elevated cholesterol at the request of Reynold Bowen, MD. This is a pleasant 68 year old female who is kindly referred by Dr. Milas Gain for evaluation of chest pain and progressive dyspnea.  In addition she has elevated cholesterol and is disinterested in statins due to her history of MS.  She reports she has had progressive dyspnea and chest pain over the past several weeks to months.  Her symptoms were significant this past Sunday and she almost went to the emergency department.  Finally her symptoms resolved.  She describes it as a central chest pressure with shortness of breath even with minimal exertion such as getting out of bed and going to the bathroom.  There is a strong family history of heart disease in her father who had an MI in his 46s or 19s and mother who had elevated cholesterol.  Personally she has a high cholesterol as well.  Her total cholesterol as of January showed total cholesterol 296, triglycerides 67, HDL 74, however LDL was 209.  Again she has not been on statin therapy before.  She does have MS but is not on treatment due to cost issues.  Other medical problems include osteoarthritis, depression and an episode of exercise related bradycardia and syncope approximately 10 years ago when she was in [place] visiting.  Apparently she had a heart catheterization which showed about a 30% blockage but no significant other findings.  She has had no further episodes like that.  12/17/2018  Kelli Pratt seen today in follow-up.  She underwent CT coronary angiography in March 2020.  This demonstrated a focal area of ostial RCA calcification with possibly significant stenosis.  She then underwent  CT FFR study which demonstrated no significant FFR change.  Previously, she had had a reported 30% stenosis and a coronary many years ago when she was in [place].  Unfortunately she does not have those records.  I am treating her medically for coronary disease which is felt to be nonobstructive and I suspect her symptoms are unrelated.  She is on low-dose aspirin however her cholesterol remains elevated.  She made recent significant dietary changes, however total cholesterol is 267 with LDL of 192.  HDL 59 and triglycerides are 81.  Her goal LDL is less than 70.  She had not previously tried statins however has significant pain and issues related to multiple sclerosis and is not interested in taking a statin medication.   PMHx:  Past Medical History:  Diagnosis Date  . Depression   . MS (multiple sclerosis) ([place])   . OA (osteoarthritis)   . Syncope and collapse     Past Surgical History:  Procedure Laterality Date  . BUNIONECTOMY    . cataracts      FAMHx:  Family History  Problem Relation Age of Onset  . Multiple myeloma Mother   . Heart attack Father   . Dementia Father     SOCHx:   reports that she has never smoked. She has never used smokeless tobacco. She reports previous alcohol use. No history on file for drug.  ALLERGIES:  Allergies  Allergen Reactions  . Versed [Midazolam]     ROS: Pertinent items noted in HPI and  remainder of comprehensive ROS otherwise negative.  HOME MEDS: Current Outpatient Medications on File Prior to Visit  Medication Sig Dispense Refill  . aspirin EC 81 MG tablet Take 1 tablet (81 mg total) by mouth daily.    . clonazePAM (KLONOPIN) 0.5 MG tablet Take 1 tablet by mouth 3 (three) times daily.    Marland Kitchen desvenlafaxine (PRISTIQ) 50 MG 24 hr tablet TAKE ONE TABLE BY MOUTH DAILY  5  . estradiol (ESTRACE) 0.5 MG tablet Take 0.5 mg by mouth daily.  2  . meloxicam (MOBIC) 15 MG tablet TAKE 1 TABLET BY MOUTH EVERY DAY 30 tablet 0  . metoprolol  tartrate (LOPRESSOR) 50 MG tablet Take 1 tablet ('50mg'$ ) 2 hours prior to your Cardiac CT 1 tablet 0  . MYRBETRIQ 50 MG TB24 tablet Take 50 mg by mouth at bedtime.  2  . NONFORMULARY OR COMPOUNDED ITEM [facility]:  Antifungal Cream - Terbinafine 3%, Fluconazole 2%, Tea Tree Oil 5%, Urea 10%, Ibuprofen 2% in DMSO #83m suspension. Apply to affected toenail(s) once (at bedtime) or twice daily. 30 each 5  . promethazine (PHENERGAN) 25 MG tablet Take 1 tablet (25 mg total) by mouth every 8 (eight) hours as needed for nausea or vomiting. 20 tablet 0  . Vitamin Kelli, Ergocalciferol, (DRISDOL) 50000 units CAPS capsule TAKE ONE (1) CAPSULE BY MOUTH ONCE (1X) A WEEK  2   No current facility-administered medications on file prior to visit.     LABS/IMAGING: No results found for this or any previous visit (from the past 48 hour(s)). No results found.  LIPID PANEL: No results found for: CHOL, TRIG, HDL, CHOLHDL, VLDL, LDLCALC, LDLDIRECT  WEIGHTS: Wt Readings from Last 3 Encounters:  12/17/18 129 lb (58.5 kg)  06/27/18 142 lb (64.4 kg)    VITALS: BP 116/82   Pulse 78   Temp (!) 97.3 F (36.3 C)   Ht '5\' 6"'$  (1.676 m)   Wt 129 lb (58.5 kg)   BMI 20.82 kg/m   EXAM: General appearance: alert and no distress Neck: no carotid bruit, no JVD and thyroid not enlarged, symmetric, no tenderness/mass/nodules Lungs: clear to auscultation bilaterally Heart: regular rate and rhythm, S1, S2 normal, no murmur, click, rub or gallop Abdomen: soft, non-tender; bowel sounds normal; no masses,  no organomegaly Extremities: extremities normal, atraumatic, no cyanosis or edema Pulses: 2+ and symmetric Skin: Skin color, texture, turgor normal. No rashes or lesions or healing left foot surgical incision over the great toe Neurologic: Grossly normal Psych: Pleasant  EKG: Normal sinus rhythm at 78, nonspecific T wave changes  ASSESSMENT: 1. Progressive precordial chest pain -nonobstructive ostial RCA  calcification (CAC score 79), negative FFR (07/2018) 2. Marked dyslipidemia 3. History of MS 4. Family history of premature coronary disease 5. History of mild nonobstructive coronary disease by cath greater than 10 years ago  PLAN: 1.   Mrs. MLatonfortunately had no significant obstructive coronary disease.  She does have coronary calcification and is appropriately on aspirin.  She has a marked dyslipidemia with LDL greater than 190 and may be at risk for further events.  She has made dietary changes recently to optimize her numbers however has persistently elevated LDL cholesterol.  I think she is a good candidate for PCSK9 inhibitor as she is unwilling to take a statin given her multiple sclerosis and concern for myalgias and other side effects.  She does need greater than 50% reduction in LDL cholesterol based on current guidelines with LDL over 190, therefore a  PCSK9 inhibitor is likely to achieve that for her.  We will pursue prior authorization for this and plan follow-up with a repeat lipid profile in 3 to 4 months with me thereafter.  Pixie Casino, MD, [facility], [place] Director of the Advanced Lipid Disorders &  Cardiovascular Risk Reduction Clinic Diplomate of the American Board of Clinical Lipidology Attending Cardiologist  Direct Dial: [phone]  Fax: [phone]  Website:  [email or web address] 12/17/2018, 2:39 PM

## 2018-12-17 NOTE — Telephone Encounter (Signed)
LVM, reminding her of her appt on 12-17-18.

## 2018-12-17 NOTE — Patient Instructions (Signed)
Medication Instructions:  Dr. Debara Pickett recommends Repatha 140 mg injection every 14 days (PCSK9). This is an injectable cholesterol medication. This medication will need prior approval with your insurance company, which we will work on. If the medication is not approved initially, we may need to do an appeal with your insurance. We will keep you updated on this process. This medication can be provided at some local pharmacies or be shipped to you from a specialty pharmacy.   If you need a refill on your cardiac medications before your next appointment, please call your pharmacy.   Lab work: Your provider would like for you to return in 4 months to have the following labs drawn: Fasting lipid. You do not need an appointment for the lab. Once in our office lobby there is a podium where you can sign in and ring the doorbell to alert [place] that you are here. The lab is open from 8:00 am to 4:30 pm; closed for lunch from 12:45pm-1:45pm.  If you have labs (blood work) drawn today and your tests are completely normal, you will receive your results only by: Marland Kitchen MyChart Message (if you have MyChart) OR . A paper copy in the mail If you have any lab test that is abnormal or we need to change your treatment, we will call you to review the results.  Testing/Procedures: None ordered  Follow-Up: At [facility], you and your health needs are our priority.  As part of our continuing mission to provide you with exceptional heart care, we have created designated Provider Care Teams.  These Care Teams include your primary Cardiologist (physician) and Advanced Practice Providers (APPs -  Physician Assistants and Nurse Practitioners) who all work together to provide you with the care you need, when you need it. You will need a follow up appointment in 4 months.  Please call our office 2 months in advance to schedule this appointment.  You may see Dr. Debara Pickett or one of the following Advanced Practice Providers on your  designated Care Team: Almyra Deforest, [place] . Fabian Sharp, PA-C

## 2018-12-18 ENCOUNTER — Telehealth: Payer: Self-pay | Admitting: Internal Medicine

## 2018-12-18 NOTE — Telephone Encounter (Signed)
PA for Repatha submitted via [email or web address]  (KeyAbelino Derrick)

## 2018-12-19 NOTE — Telephone Encounter (Signed)
PA denied:  Message from Plan The benefit provides coverage for the requested drug when medically necessary. However, the information submitted doesn't meet [facility] medical necessity guidelines for coverage. The member must meet the following criteria: is determined to have statin-associated muscle symptoms (SAMs) due to rhabdomyolysis or failure to achieve goal LDL-C reduction because of SAMs despite both lowering of statin strength and attempting a different statin or Repatha (evolocumab) will be used as adjunctive therapy to maximally tolerated high-intensity statin (e.g. atorvastatin or rosuvastatin) in patients that have failed to achieve goal LDL-C reduction on statin therapy. This determination was based on the [place] (evolocumab) Coverage Policy.

## 2018-12-21 ENCOUNTER — Encounter: Payer: Self-pay | Admitting: Podiatry

## 2018-12-21 MED ORDER — ROSUVASTATIN CALCIUM 5 MG PO TABS: 5.0000 mg | ORAL_TABLET | ORAL | 3 refills | Status: DC

## 2018-12-21 NOTE — Addendum Note (Signed)
Addended by: Fidel Levy on: 12/21/2018 12:39 PM   Modules accepted: Orders

## 2018-12-21 NOTE — Telephone Encounter (Signed)
Thanks

## 2018-12-21 NOTE — Telephone Encounter (Signed)
Will need to be on max tolerated statin to qualify for PCSK9i - recommend starting Crestor 5 mg QOD. Repeat lipids in 3 months.  Dr Lemmie Evens

## 2018-12-21 NOTE — Telephone Encounter (Signed)
LMTCB Sent info in [employer]

## 2018-12-29 ENCOUNTER — Other Ambulatory Visit: Payer: Self-pay | Admitting: Podiatry

## 2019-01-01 ENCOUNTER — Ambulatory Visit: Payer: Medicare HMO | Admitting: Podiatry

## 2019-01-01 ENCOUNTER — Other Ambulatory Visit: Payer: Self-pay

## 2019-01-01 DIAGNOSIS — M779 Enthesopathy, unspecified: Secondary | ICD-10-CM | POA: Diagnosis not present

## 2019-01-01 DIAGNOSIS — S93622D Sprain of tarsometatarsal ligament of left foot, subsequent encounter: Secondary | ICD-10-CM

## 2019-01-01 NOTE — Progress Notes (Signed)
Subjective: 68 year old female presents the office today for follow-up evaluation of left foot pain.  She states that her pain is intermittent.  She states that some days it feels fine some days the pain gets worse.  She did have some mild swelling over the weekend but denies any redness or warmth.  She is been wearing a stiffer soled shoe when she does not do this is been helpful and steroid injection was helpful. Denies any systemic complaints such as fevers, chills, nausea, vomiting. No acute changes since last appointment, and no other complaints at this time.   Objective: AAO x3, NAD DP/PT pulses palpable bilaterally, CRT less than 3 seconds There is tenderness mostly in submetatarsal base status for the Lisfranc joint.  Tenderness does go towards about 1 cm distal to this area as well.  There is no edema appreciated today and has no pain vibratory sensation.  No erythema or warmth. No open lesions or pre-ulcerative lesions.  No pain with calf compression, swelling, warmth, erythema  Assessment: Left foot injury/capsulitis  Plan: -All treatment options discussed with the patient including all alternatives, risks, complications.  -Again concern for possible Lisfranc injury however pain does not seem to fit this.  She had days without any discomfort.  She wanted to hold off on MRI.  I did a steroid injection today submetatarsal cuneiform joint.  Risks discussed.  I prepped the area with Betadine, alcohol.  A mixture of 0.50 cc of Kenalog 10, 0.50 cc of dexamethasone phosphate as well as local anesthetic into the area without complications.  She tolerated well there are any complications.  Postinjection care discussed. -I had Rick modified insert for her to help take pressure off the area as well.  Return in about 4 weeks (around 01/29/2019).  Trula Slade DPM  -Patient encouraged to call the office with any questions, concerns, change in symptoms.

## 2019-01-01 NOTE — Telephone Encounter (Signed)
Patient is coming into the office today 01/01/2019. Kelli Pratt

## 2019-01-14 ENCOUNTER — Other Ambulatory Visit: Payer: Self-pay

## 2019-01-14 ENCOUNTER — Telehealth: Payer: Self-pay

## 2019-01-14 NOTE — Telephone Encounter (Signed)
Refill request for meloxicam denied

## 2019-01-31 ENCOUNTER — Ambulatory Visit (INDEPENDENT_AMBULATORY_CARE_PROVIDER_SITE_OTHER): Payer: Medicare HMO | Admitting: Podiatry

## 2019-01-31 ENCOUNTER — Other Ambulatory Visit: Payer: Self-pay

## 2019-01-31 DIAGNOSIS — M779 Enthesopathy, unspecified: Secondary | ICD-10-CM | POA: Diagnosis not present

## 2019-01-31 DIAGNOSIS — S93622D Sprain of tarsometatarsal ligament of left foot, subsequent encounter: Secondary | ICD-10-CM

## 2019-02-11 ENCOUNTER — Ambulatory Visit: Payer: Medicare HMO

## 2019-02-11 ENCOUNTER — Other Ambulatory Visit: Payer: Self-pay

## 2019-02-11 DIAGNOSIS — M7752 Other enthesopathy of left foot: Secondary | ICD-10-CM

## 2019-02-11 NOTE — Progress Notes (Signed)
Subjective: 68 year old female presents the office today for follow-up evaluation of left foot pain.  Overall she states that she still having discomfort.  Pain is intermittent.  Overall feels the same.  She describes a constant aggravation is the most significant pain on applying pressure.  Injection was not helpful. Denies any systemic complaints such as fevers, chills, nausea, vomiting. No acute changes since last appointment, and no other complaints at this time.   Objective: AAO x3, NAD DP/PT pulses palpable bilaterally, CRT less than 3 seconds To the majority of tenderness is along the second interspace proximally.  There is no erythema.  No significant area pinpoint tenderness. Mild crepitation first MPJ range of motion left side No open lesions or pre-ulcerative lesions.  No pain with calf compression, swelling, warmth, erythema  Assessment: Left foot injury/capsulitis  Plan: -All treatment options discussed with the patient including all alternatives, risks, complications.  -Overall her symptoms are about the same.  She is tried shoe modifications, inserts.  Injections have not been helpful.  We will try EPAT (will not charge for this).  Also consider physical therapy.  No improvement MRI.  Trula Slade DPM

## 2019-02-18 ENCOUNTER — Other Ambulatory Visit: Payer: Medicare HMO

## 2019-02-18 ENCOUNTER — Other Ambulatory Visit: Payer: Self-pay

## 2019-02-25 ENCOUNTER — Ambulatory Visit: Payer: Medicare HMO

## 2019-02-25 ENCOUNTER — Other Ambulatory Visit: Payer: Self-pay

## 2019-02-25 DIAGNOSIS — M7752 Other enthesopathy of left foot: Secondary | ICD-10-CM

## 2019-02-26 NOTE — Progress Notes (Signed)
Patient is here today with complaint of left foot pain, pain is intermittent, she describes it as constant aggravation when applying pressure.  She is recently had injections, and anti-inflammatories, which were not helpful.  Recently diagnosed with left foot capsulitis.  ESWT administered to the top of the foot for 3.5 J and tolerated well.  She is to follow-up next week for an additional treatment.  I advised her to avoid NSAIDs and ice and wear supportive shoes.

## 2019-02-28 NOTE — Progress Notes (Signed)
Patient is here today with complaint of left foot pain, pain is intermittent, she describes it as constant aggravation when applying pressure.  She is recently had injections, and anti-inflammatories, which were not helpful.  Recently diagnosed with left foot capsulitis. She states that she has not noticed much change in her foot pain.  ESWT administered to the top of the foot for 3 J and tolerated well.  She is to follow-up next week for an additional treatment.  I advised her to avoid NSAIDs and ice and wear supportive shoes.

## 2019-03-05 ENCOUNTER — Telehealth: Payer: Self-pay | Admitting: Internal Medicine

## 2019-03-05 NOTE — Telephone Encounter (Signed)
Left message regarding appointment time change for 04/23/19 for Dr. Donia Guiles changed from 11:00 am to 11:15 am---requested return confirmation call

## 2019-03-28 DIAGNOSIS — Z23 Encounter for immunization: Secondary | ICD-10-CM | POA: Diagnosis not present

## 2019-03-29 ENCOUNTER — Other Ambulatory Visit: Payer: Self-pay

## 2019-03-29 ENCOUNTER — Ambulatory Visit: Payer: Medicare HMO

## 2019-03-29 DIAGNOSIS — M7752 Other enthesopathy of left foot: Secondary | ICD-10-CM

## 2019-03-29 NOTE — Progress Notes (Signed)
Patient is here today with complaint of left foot pain, pain is intermittent, she describes it as constant aggravation when applying pressure.  She is recently had injections, and anti-inflammatories, which were not helpful.  Recently diagnosed with left foot capsulitis. She states that she has not noticed much change in her foot pain.  ESWT administered to the top of the foot for 5 J and tolerated well.  She is to follow-up next week for an additional treatment. She is to follow up with Dr Jacqualyn Posey in 3 weeks.

## 2019-04-15 ENCOUNTER — Other Ambulatory Visit: Payer: Self-pay | Admitting: Internal Medicine

## 2019-04-15 DIAGNOSIS — E782 Mixed hyperlipidemia: Secondary | ICD-10-CM | POA: Diagnosis not present

## 2019-04-16 LAB — LIPID PANEL
Chol/HDL Ratio: 2.7 ratio (ref 0.0–4.4)
Cholesterol, Total: 180 mg/dL (ref 100–199)
HDL: 67 mg/dL (ref >39)
LDL Chol Calc (NIH): 94 mg/dL (ref 0–99)
Triglycerides: 107 mg/dL (ref 0–149)
VLDL Cholesterol Cal: 19 mg/dL (ref 5–40)

## 2019-04-23 ENCOUNTER — Ambulatory Visit: Payer: Medicare HMO | Admitting: Internal Medicine

## 2019-04-25 ENCOUNTER — Ambulatory Visit: Payer: Medicare HMO | Admitting: Podiatry

## 2019-04-25 ENCOUNTER — Other Ambulatory Visit: Payer: Self-pay

## 2019-04-25 ENCOUNTER — Encounter: Payer: Self-pay | Admitting: Podiatry

## 2019-04-25 DIAGNOSIS — M799 Soft tissue disorder, unspecified: Secondary | ICD-10-CM | POA: Diagnosis not present

## 2019-04-25 DIAGNOSIS — M7752 Other enthesopathy of left foot: Secondary | ICD-10-CM | POA: Diagnosis not present

## 2019-04-26 NOTE — Progress Notes (Signed)
Subjective: 68 year old female presents the office today for follow-up evaluation of left foot pain.  She states that she still gets pain to the foot.  She notices mostly to the point that she has a small amount of "crunchy" sensation when she is first starts to walk after she walks it eases up.  She gets some swelling to the area at times.  Her main concern is that she still with some pain to the top of her foot that started after she felt a pop several months ago.  We have done injections, immobilization, orthotics, EPAT without any significant improvement.  She has never had significant swelling.  She did been able to walk and muscle strength appears be intact.  Denies any systemic complaints such as fevers, chills, nausea, vomiting. No acute changes since last appointment, and no other complaints at this time.   Objective: AAO x3, NAD DP/PT pulses palpable bilaterally, CRT less than 3 seconds To the dorsal aspect of the left foot along the left second metatarsal cuneiform joint there is a small moderate edema and a small cyst is palpable today.  This is where she is majority of tenderness.  There is some swelling on the first interspace.  She has good range of motion of first MPJ.  On first initial range of motion there was a small meta crepitation but has the joint was intact there was no further crepitation. No pain with calf compression, swelling, warmth, erythema  Assessment: Left foot injury/capsulitis  Plan: -All treatment options discussed with the patient including all alternatives, risks, complications.  -She is doing about the same without any significant present.  Some people to palpate a small severe swelling.  I would order an ultrasound of the area. -Continue wear supportive shoes and inserts at all times particularly with exercising.  Follow-up with ultrasound or sooner if needed.  Kelli Pratt DPM

## 2019-04-29 ENCOUNTER — Telehealth: Payer: Self-pay | Admitting: *Deleted

## 2019-04-29 DIAGNOSIS — M799 Soft tissue disorder, unspecified: Secondary | ICD-10-CM

## 2019-04-29 NOTE — Telephone Encounter (Signed)
Faxed orders to [facility]. 

## 2019-04-29 NOTE — Telephone Encounter (Signed)
-----   Message from Trula Slade, DPM sent at 04/26/2019  7:40 AM EST ----- Can you please order a diagnostic ultrasound of the left foot to evaluate a soft tissue lesion? Thanks.

## 2019-05-01 ENCOUNTER — Ambulatory Visit
Admission: RE | Admit: 2019-05-01 | Discharge: 2019-05-01 | Disposition: A | Discharge status: Home / Self Care | Payer: Medicare HMO | Source: Ambulatory Visit | Attending: Podiatry | Admitting: Podiatry

## 2019-05-01 DIAGNOSIS — M799 Soft tissue disorder, unspecified: Secondary | ICD-10-CM

## 2019-05-01 DIAGNOSIS — R2242 Localized swelling, mass and lump, left lower limb: Secondary | ICD-10-CM | POA: Diagnosis not present

## 2019-05-07 ENCOUNTER — Other Ambulatory Visit: Payer: Self-pay

## 2019-05-07 ENCOUNTER — Ambulatory Visit (INDEPENDENT_AMBULATORY_CARE_PROVIDER_SITE_OTHER): Payer: Medicare HMO | Admitting: Internal Medicine

## 2019-05-07 ENCOUNTER — Encounter: Payer: Self-pay | Admitting: Internal Medicine

## 2019-05-07 VITALS — BP 130/79 | HR 79 | Temp 97.3°F | Ht 66.0 in | Wt 124.6 lb

## 2019-05-07 DIAGNOSIS — I2510 Atherosclerotic heart disease of native coronary artery without angina pectoris: Secondary | ICD-10-CM

## 2019-05-07 DIAGNOSIS — E782 Mixed hyperlipidemia: Secondary | ICD-10-CM

## 2019-05-07 DIAGNOSIS — R002 Palpitations: Secondary | ICD-10-CM | POA: Diagnosis not present

## 2019-05-07 DIAGNOSIS — R931 Abnormal findings on diagnostic imaging of heart and coronary circulation: Secondary | ICD-10-CM | POA: Diagnosis not present

## 2019-05-07 NOTE — Patient Instructions (Signed)
Medication Instructions:  Your physician recommends that you continue on your current medications as directed. Please refer to the Current Medication list given to you today.  *If you need a refill on your cardiac medications before your next appointment, please call your pharmacy*  Follow-Up: At [facility], you and your health needs are our priority.  As part of our continuing mission to provide you with exceptional heart care, we have created designated Provider Care Teams.  These Care Teams include your primary Cardiologist (physician) and Advanced Practice Providers (APPs -  Physician Assistants and Nurse Practitioners) who all work together to provide you with the care you need, when you need it.  Your next appointment:  AS NEEDED with Dr. Hilty  

## 2019-05-07 NOTE — Progress Notes (Signed)
LIPID CLINIC CONSULT NOTE  Chief Complaint:  Follow-up dyslipidemia  Primary Care Physician: Reynold Bowen, MD  Primary Cardiologist:  No primary care provider on file.  HPI:  Kelli Pratt is a 68 y.o. female who is being seen today for the evaluation of chest pain, dyspnea, and elevated cholesterol at the request of Reynold Bowen, MD. This is a pleasant 69 year old female who is kindly referred by Dr. Milas Gain for evaluation of chest pain and progressive dyspnea.  In addition she has elevated cholesterol and is disinterested in statins due to her history of MS.  She reports she has had progressive dyspnea and chest pain over the past several weeks to months.  Her symptoms were significant this past Sunday and she almost went to the emergency department.  Finally her symptoms resolved.  She describes it as a central chest pressure with shortness of breath even with minimal exertion such as getting out of bed and going to the bathroom.  There is a strong family history of heart disease in her father who had an MI in his 87s or 58s and mother who had elevated cholesterol.  Personally she has a high cholesterol as well.  Her total cholesterol as of January showed total cholesterol 296, triglycerides 67, HDL 74, however LDL was 209.  Again she has not been on statin therapy before.  She does have MS but is not on treatment due to cost issues.  Other medical problems include osteoarthritis, depression and an episode of exercise related bradycardia and syncope approximately 10 years ago when she was in [place] visiting.  Apparently she had a heart catheterization which showed about a 30% blockage but no significant other findings.  She has had no further episodes like that.  12/17/2018  Kelli Pratt seen today in follow-up.  She underwent CT coronary angiography in March 2020.  This demonstrated a focal area of ostial RCA calcification with possibly significant stenosis.  She then underwent CT FFR study which  demonstrated no significant FFR change.  Previously, she had had a reported 30% stenosis and a coronary many years ago when she was in [place].  Unfortunately she does not have those records.  I am treating her medically for coronary disease which is felt to be nonobstructive and I suspect her symptoms are unrelated.  She is on low-dose aspirin however her cholesterol remains elevated.  She made recent significant dietary changes, however total cholesterol is 267 with LDL of 192.  HDL 59 and triglycerides are 81.  Her goal LDL is less than 70.  She had not previously tried statins however has significant pain and issues related to multiple sclerosis and is not interested in taking a statin medication.   05/07/2019  Kelli Pratt is seen today in follow-up.  Overall she is doing well.  Although we had applied for PCSK9 inhibitor, it was denied by her insurance.  Subsequently I suggested low-dose rosuvastatin 5 mg every other day.  Fortunately she has been taking this and it has not affected her MS.  In addition she has made significant dietary changes.  This is resulted in marked improvement in her lipid profile.  Total cholesterol now 180, triglycerides 107, HDL 67 and LDL of 94.  I did feel that her goal LDL is less than 70 although she thought that her having very mild coronary disease, that she does not need to be as aggressively managed.  She also reports that she had been having some palpitations which we did not previously  addressed.  This is a symptom of her heart racing at times but not on routine basis at night when she wakes up.  Options could be arrhythmia versus possible undiagnosed sleep apnea.   PMHx:  Past Medical History:  Diagnosis Date  . Depression   . MS (multiple sclerosis) (HCC)   . OA (osteoarthritis)   . Syncope and collapse     Past Surgical History:  Procedure Laterality Date  . BUNIONECTOMY    . cataracts      FAMHx:  Family History  Problem Relation Age of Onset  . Multiple  myeloma Mother   . Heart attack Father   . Dementia Father     SOCHx:   reports that she has never smoked. She has never used smokeless tobacco. She reports previous alcohol use. No history on file for drug.  ALLERGIES:  Allergies  Allergen Reactions  . Versed [Midazolam]     ROS: Pertinent items noted in HPI and remainder of comprehensive ROS otherwise negative.  HOME MEDS: Current Outpatient Medications on File Prior to Visit  Medication Sig Dispense Refill  . aspirin EC 81 MG tablet Take 1 tablet (81 mg total) by mouth daily.    . clonazePAM (KLONOPIN) 0.5 MG tablet Take 1 tablet by mouth 3 (three) times daily.    Marland Kitchen desvenlafaxine (PRISTIQ) 50 MG 24 hr tablet TAKE ONE TABLE BY MOUTH DAILY  5  . estradiol (ESTRACE) 0.5 MG tablet Take 0.5 mg by mouth daily.  2  . meloxicam (MOBIC) 15 MG tablet TAKE 1 TABLET BY MOUTH EVERY DAY 30 tablet 0  . metoprolol tartrate (LOPRESSOR) 50 MG tablet Take 1 tablet (50mg ) 2 hours prior to your Cardiac CT 1 tablet 0  . MYRBETRIQ 50 MG TB24 tablet Take 50 mg by mouth at bedtime.  2  . NONFORMULARY OR COMPOUNDED ITEM Apothecary:  Antifungal Cream - Terbinafine 3%, Fluconazole 2%, Tea Tree Oil 5%, Urea 10%, Ibuprofen 2% in DMSO #81ml suspension. Apply to affected toenail(s) once (at bedtime) or twice daily. 30 each 5  . promethazine (PHENERGAN) 25 MG tablet Take 1 tablet (25 mg total) by mouth every 8 (eight) hours as needed for nausea or vomiting. 20 tablet 0  . Vitamin Kelli, Ergocalciferol, (DRISDOL) 50000 units CAPS capsule TAKE ONE (1) CAPSULE BY MOUTH ONCE (1X) A WEEK  2  . rosuvastatin (CRESTOR) 5 MG tablet Take 1 tablet (5 mg total) by mouth every other day. 45 tablet 3   No current facility-administered medications on file prior to visit.    LABS/IMAGING: No results found for this or any previous visit (from the past 48 hour(s)). No results found.  LIPID PANEL:    Component Value Date/Time   CHOL 180 04/15/2019 1035   TRIG 107  04/15/2019 1035   HDL 67 04/15/2019 1035   CHOLHDL 2.7 04/15/2019 1035   LDLCALC 94 04/15/2019 1035    WEIGHTS: Wt Readings from Last 3 Encounters:  05/07/19 124 lb 9.6 oz (56.5 kg)  12/17/18 129 lb (58.5 kg)  06/27/18 142 lb (64.4 kg)    VITALS: BP 130/79   Pulse 79   Temp (!) 97.3 F (36.3 C)   Ht 5\' 6"  (1.676 m)   Wt 124 lb 9.6 oz (56.5 kg)   SpO2 98%   BMI 20.11 kg/m   EXAM: Deferred  EKG: Deferred  ASSESSMENT: 1. Progressive precordial chest pain -nonobstructive ostial RCA calcification (CAC score 79), negative FFR (07/2018) 2. Marked dyslipidemia 3. History of MS  4. Family history of premature coronary disease 5. History of mild nonobstructive coronary disease by cath greater than 10 years ago  PLAN: 1.   Mrs. Pete PeltMegel has had significant improvement in her lipids although has known coronary disease and a calcium score which was in the low risk category.  Nonetheless her target LDL is less than 70 however she feels at this point she has been adequately treated.  We could consider options of increasing her frequency of rosuvastatin to daily or the dose or adding ezetimibe, but she wants to continue to work on diet and lifestyle changes.  I also offered to monitor her for recurrent arrhythmias or consider even a sleep study because of her nocturnal palpitations but she will just continue to observe that for now.  Plan follow-up with Dr. Evlyn [place]annerSouth and me as needed.  Chrystie NoseKenneth C. Janessa Mickle, MD, [facility] Crown PointFACC, FACP  [place]  [facility]  Medical Director of the Advanced Lipid Disorders &  Cardiovascular Risk Reduction Clinic Diplomate of the American Board of Clinical Lipidology Attending Cardiologist  Direct Dial: [phone]  Fax: [phone]  Website:  [email or web address]  Torrey Ballinas C Tawania Daponte 05/07/2019, 3:16 PM

## 2019-05-10 ENCOUNTER — Telehealth: Payer: Self-pay | Admitting: *Deleted

## 2019-05-10 ENCOUNTER — Encounter: Payer: Self-pay | Admitting: Podiatry

## 2019-05-10 NOTE — Telephone Encounter (Signed)
-----   Message from Trula Slade, DPM sent at 05/09/2019  6:36 PM EST ----- Val- I put this note in epic on mychart but can you call her to let her know her thought about it?  ----  Good evening! I just wanted to let you know that the ultrasound did not show anything. I was thinking about the next steps. I was thinking about having you do laser therapy for the pain. Some forms of laser can help calm down inflammation. We do not do it here but I can get you set up to have it done if you are interested. Let me know your thoughts! Have a good night.

## 2019-05-10 NOTE — Telephone Encounter (Signed)
Left message requesting a call to discuss Dr.Wagoner's MyChart message.

## 2019-05-31 DIAGNOSIS — R32 Unspecified urinary incontinence: Secondary | ICD-10-CM | POA: Diagnosis not present

## 2019-05-31 DIAGNOSIS — H3530 Unspecified macular degeneration: Secondary | ICD-10-CM | POA: Diagnosis not present

## 2019-05-31 DIAGNOSIS — M79672 Pain in left foot: Secondary | ICD-10-CM | POA: Diagnosis not present

## 2019-05-31 DIAGNOSIS — E785 Hyperlipidemia, unspecified: Secondary | ICD-10-CM | POA: Diagnosis not present

## 2019-05-31 DIAGNOSIS — F329 Major depressive disorder, single episode, unspecified: Secondary | ICD-10-CM | POA: Diagnosis not present

## 2019-05-31 DIAGNOSIS — F5104 Psychophysiologic insomnia: Secondary | ICD-10-CM | POA: Diagnosis not present

## 2019-05-31 DIAGNOSIS — I2510 Atherosclerotic heart disease of native coronary artery without angina pectoris: Secondary | ICD-10-CM | POA: Diagnosis not present

## 2019-05-31 DIAGNOSIS — G35 Multiple sclerosis: Secondary | ICD-10-CM | POA: Diagnosis not present

## 2019-06-05 ENCOUNTER — Other Ambulatory Visit: Payer: Self-pay | Admitting: Endocrinology

## 2019-06-05 DIAGNOSIS — Z1231 Encounter for screening mammogram for malignant neoplasm of breast: Secondary | ICD-10-CM

## 2019-06-12 ENCOUNTER — Other Ambulatory Visit: Payer: Self-pay

## 2019-06-13 MED ORDER — ROSUVASTATIN CALCIUM 5 MG PO TABS: 5.0000 mg | ORAL_TABLET | ORAL | 0 refills | Status: DC

## 2019-06-13 NOTE — Telephone Encounter (Signed)
For future refills send to PCP  Pt only to see Dr Rennis Golden as needed

## 2019-07-07 ENCOUNTER — Ambulatory Visit: Payer: Medicare HMO | Attending: Internal Medicine

## 2019-07-07 DIAGNOSIS — Z23 Encounter for immunization: Secondary | ICD-10-CM | POA: Insufficient documentation

## 2019-07-07 NOTE — Progress Notes (Signed)
   Covid-19 Vaccination Clinic  Name:  Deissy Guilbert    MRN: [id number] DOB: [birth year]  07/07/2019  Ms. Callejo was observed post Covid-19 immunization for 15 minutes without incidence. She was provided with Vaccine Information Sheet and instruction to access the V-Safe system.   Ms. Vacca was instructed to call 911 with any severe reactions post vaccine: Marland Kitchen Difficulty breathing  . Swelling of your face and throat  . A fast heartbeat  . A bad rash all over your body  . Dizziness and weakness    Immunizations Administered    Name Date Dose VIS Date Route   Pfizer COVID-19 Vaccine 07/07/2019  1:35 PM 0.3 mL 05/03/2019 Intramuscular   Manufacturer: [employer], [employer]   Lot: [id number]   NDC: [address]-8

## 2019-07-12 ENCOUNTER — Ambulatory Visit: Payer: Medicare HMO

## 2019-07-30 ENCOUNTER — Ambulatory Visit: Payer: Medicare HMO | Attending: Internal Medicine

## 2019-07-30 DIAGNOSIS — Z23 Encounter for immunization: Secondary | ICD-10-CM

## 2019-07-30 NOTE — Progress Notes (Signed)
   Covid-19 Vaccination Clinic  Name:  Kennley Schwandt    MRN: [id number] DOB: [birth year]  07/30/2019  Ms. Onstad was observed post Covid-19 immunization for 15 minutes without incident. She was provided with Vaccine Information Sheet and instruction to access the V-Safe system.   Ms. Van was instructed to call 911 with any severe reactions post vaccine: Marland Kitchen Difficulty breathing  . Swelling of face and throat  . A fast heartbeat  . A bad rash all over body  . Dizziness and weakness   Immunizations Administered    Name Date Dose VIS Date Route   Pfizer COVID-19 Vaccine 07/30/2019  1:28 PM 0.3 mL 05/03/2019 Intramuscular   Manufacturer: [employer], [employer]   Lot: [id number]   NDC: [address]-4

## 2019-07-31 ENCOUNTER — Ambulatory Visit: Payer: Medicare HMO

## 2019-08-14 ENCOUNTER — Other Ambulatory Visit: Payer: Self-pay

## 2019-08-14 ENCOUNTER — Ambulatory Visit
Admission: RE | Admit: 2019-08-14 | Discharge: 2019-08-14 | Disposition: A | Discharge status: Home / Self Care | Payer: Medicare HMO | Source: Ambulatory Visit | Attending: Endocrinology | Admitting: Endocrinology

## 2019-08-14 DIAGNOSIS — Z1231 Encounter for screening mammogram for malignant neoplasm of breast: Secondary | ICD-10-CM

## 2019-08-26 ENCOUNTER — Encounter: Payer: Self-pay | Admitting: Podiatry

## 2019-08-26 ENCOUNTER — Other Ambulatory Visit: Payer: Self-pay

## 2019-08-26 ENCOUNTER — Ambulatory Visit: Payer: Medicare HMO | Admitting: Podiatry

## 2019-08-26 ENCOUNTER — Ambulatory Visit (INDEPENDENT_AMBULATORY_CARE_PROVIDER_SITE_OTHER): Payer: Medicare HMO

## 2019-08-26 DIAGNOSIS — M205X2 Other deformities of toe(s) (acquired), left foot: Secondary | ICD-10-CM

## 2019-08-26 DIAGNOSIS — M7752 Other enthesopathy of left foot: Secondary | ICD-10-CM

## 2019-08-26 NOTE — Patient Instructions (Signed)
Soak Instructions    THE DAY AFTER THE PROCEDURE  Place 1/4 cup of epsom salts in a quart of warm tap water.  Submerge your foot or feet with outer bandage intact for the initial soak; this will allow the bandage to become moist and wet for easy lift off.  Once you remove your bandage, continue to soak in the solution for 20 minutes.  This soak should be done twice a day.  Next, remove your foot or feet from solution, blot dry the affected area and cover.  You may use a band aid large enough to cover the area or use gauze and tape.  Apply other medications to the area as directed by the doctor such as polysporin neosporin.  IF YOUR SKIN BECOMES IRRITATED WHILE USING THESE INSTRUCTIONS, IT IS OKAY TO SWITCH TO  WHITE VINEGAR AND WATER. Or you may use antibacterial soap and water to keep the toe clean  Monitor for any signs/symptoms of infection. Call the office immediately if any occur or go directly to the emergency room. Call with any questions/concerns.   Pre-Operative Instructions  Congratulations, you have decided to take an important step to improving your quality of life.  You can be assured that the doctors of [facility] will be with you every step of the way.  1. Plan to be at the surgery center/hospital at least 1 (one) hour prior to your scheduled time unless otherwise directed by the surgical center/hospital staff.  You must have a responsible adult accompany you, remain during the surgery and drive you home.  Make sure you have directions to the surgical center/hospital and know how to get there on time. 2. For hospital based surgery you will need to obtain a history and physical form from your family physician within 1 month prior to the date of surgery- we will give you a form for you primary physician.  3. We make every effort to accommodate the date you request for surgery.  There are however, times where surgery dates or times have to be moved.  We will contact you as soon  as possible if a change in schedule is required.   4. No Aspirin/Ibuprofen for one week before surgery.  If you are on aspirin, any non-steroidal anti-inflammatory medications (Mobic, Aleve, Ibuprofen) you should stop taking it 7 days prior to your surgery.  You make take Tylenol  For pain prior to surgery.  5. Medications- If you are taking daily heart and blood pressure medications, seizure, reflux, allergy, asthma, anxiety, pain or diabetes medications, make sure the surgery center/hospital is aware before the day of surgery so they may notify you which medications to take or avoid the day of surgery. 6. No food or drink after midnight the night before surgery unless directed otherwise by surgical center/hospital staff. 7. No alcoholic beverages 24 hours prior to surgery.  No smoking 24 hours prior to or 24 hours after surgery. 8. Wear loose pants or shorts- loose enough to fit over bandages, boots, and casts. 9. No slip on shoes, sneakers are best. 10. Bring your boot with you to the surgery center/hospital.  Also bring crutches or a walker if your physician has prescribed it for you.  If you do not have this equipment, it will be provided for you after surgery. 11. If you have not been contracted by the surgery center/hospital by the day before your surgery, call to confirm the date and time of your surgery. 12. Leave-time from work may   vary depending on the type of surgery you have.  Appropriate arrangements should be made prior to surgery with your employer. 13. Prescriptions will be provided immediately following surgery by your doctor.  Have these filled as soon as possible after surgery and take the medication as directed. 14. Remove nail polish on the operative foot. 15. Wash the night before surgery.  The night before surgery wash the foot and leg well with the antibacterial soap provided and water paying special attention to beneath the toenails and in between the toes.  Rinse thoroughly  with water and dry well with a towel.  Perform this wash unless told not to do so by your physician.  Enclosed: 1 Ice pack (please put in freezer the night before surgery)   1 Hibiclens skin cleaner   Pre-op Instructions  If you have any questions regarding the instructions, do not hesitate to call our office at any point during this process.   [place]: [address] [place], [place] [address] [phone]  [place]: [address] [place], [place] [address] [phone]  Dr. Yohannes Waibel, DPM  

## 2019-08-29 NOTE — Progress Notes (Signed)
Subjective: 69 year old female presents the office today for concerns of gait instability and pain to her left big toe joint.  She states that she can move the big toe but she feels " crunchiness" when trying to bend it was causing discomfort and gait issues.  She wants to discuss further treatment options at this time for her.  She has tried shoe modifications, offloading, orthotics are a significant improvement. Denies any systemic complaints such as fevers, chills, nausea, vomiting. No acute changes since last appointment, and no other complaints at this time.   Objective: AAO x3, NAD DP/PT pulses palpable bilaterally, CRT less than 3 seconds Incision from prior surgery well-healed.  There does appear to be adequate range of motion however there is crepitation with range of motion of the first MPJ.  Discomfort to palpation on the first to be changes minimal edema.  No erythema or warmth but there is no other areas of discomfort.  No open lesions or pre-ulcerative lesions.  No pain with calf compression, swelling, warmth, erythema  Assessment: Hallux limitus, capsulitis left first MPJ  Plan: -All treatment options discussed with the patient including all alternatives, risks, complications.  -X-rays obtained reviewed.  Hardware intact from previous implant arthroplasty.  No evidence of acute fracture.  Decreased joint space on the first MPJ. -We discussed both conservative as well as surgical treatment options.  At this time she was to proceed with surgical intervention she is attempted conservative care without any significant improvement.  She has tried shoe modifications, orthotics, offloading with a significant provement.  Discussed with her first MPJ arthrodesis but she does not want to proceed with a fusion.  Discussed with her total first MPJ arthroplasty with implant and we will proceed with this.  She understands that if this were to fail but she would need to have it arthrodesis. -The  incision placement as well as the postoperative course was discussed with the patient. I discussed risks of the surgery which include, but not limited to, infection, bleeding, pain, swelling, need for further surgery, delayed or nonhealing, painful or ugly scar, numbness or sensation changes, over/under correction, recurrence, transfer lesions, further deformity, hardware failure, DVT/PE, loss of toe/foot. Patient understands these risks and wishes to proceed with surgery. The surgical consent was reviewed with the patient all 3 pages were signed. No promises or guarantees were given to the outcome of the procedure. All questions were answered to the best of my ability. Before the surgery the patient was encouraged to call the office if there is any further questions. The surgery will be performed at the [facility] on an outpatient basis. -Patient encouraged to call the office with any questions, concerns, change in symptoms.   Vivi Barrack DPM

## 2019-09-02 ENCOUNTER — Telehealth: Payer: Self-pay | Admitting: Podiatry

## 2019-09-02 NOTE — Telephone Encounter (Signed)
DOS 09/04/19  KELLER BUNION IMPLANT LT - 06539 AVULSION OF NAIL PLATE T5 - 90852  [employer] EFFECTIVE DATE - 05/24/2019  PLAN DEDUCTIBLE - $0.00 OUT OF POCKET - $3900 W/ $0.00 MET COPAY $295.00 COINSURANCE - 0%    Approved Authorization #[id number] (Tracking #[id number]) Request Details   Primary Diagnosis M20.22 - Hallux rigidus, left foot Secondary Diagnosis M77.52 Expedited No Date of Service Start: 09/04/2019 Place Type Outpatient Place of service [facility] Units 1 Service frequency One-time [place] Ordering Provider Trula Slade [facility] Performing Provider Trula Slade DPM Requested by Cecille Aver Portal PAL Category CPT code Code description Foot Surgeries: Bunionectomy and Hammertoe  [phone]  Correction of rigid deformity of first joint of big toe using implant

## 2019-09-04 ENCOUNTER — Encounter: Payer: Self-pay | Admitting: Podiatry

## 2019-09-04 ENCOUNTER — Other Ambulatory Visit: Payer: Self-pay | Admitting: Podiatry

## 2019-09-04 DIAGNOSIS — E7800 Pure hypercholesterolemia, unspecified: Secondary | ICD-10-CM | POA: Diagnosis not present

## 2019-09-04 DIAGNOSIS — Z967 Presence of other bone and tendon implants: Secondary | ICD-10-CM | POA: Diagnosis not present

## 2019-09-04 DIAGNOSIS — M2022 Hallux rigidus, left foot: Secondary | ICD-10-CM | POA: Diagnosis not present

## 2019-09-04 DIAGNOSIS — M7752 Other enthesopathy of left foot: Secondary | ICD-10-CM | POA: Diagnosis not present

## 2019-09-04 DIAGNOSIS — M25572 Pain in left ankle and joints of left foot: Secondary | ICD-10-CM | POA: Diagnosis not present

## 2019-09-04 MED ORDER — CEPHALEXIN 500 MG PO CAPS: 500.0000 mg | ORAL_CAPSULE | Freq: Three times a day (TID) | ORAL | 0 refills | Status: DC

## 2019-09-04 MED ORDER — OXYCODONE-ACETAMINOPHEN 5-325 MG PO TABS: 1.0000 | ORAL_TABLET | Freq: Four times a day (QID) | ORAL | 0 refills | Status: AC | PRN

## 2019-09-04 MED ORDER — PROMETHAZINE HCL 25 MG PO TABS: 25.0000 mg | ORAL_TABLET | Freq: Three times a day (TID) | ORAL | 0 refills | Status: AC | PRN

## 2019-09-04 NOTE — Progress Notes (Signed)
Postop medications sent 

## 2019-09-05 ENCOUNTER — Telehealth: Payer: Self-pay | Admitting: *Deleted

## 2019-09-05 NOTE — Telephone Encounter (Signed)
Called and spoke with the patient and stated that I was calling to check on patient after having surgery with Dr Ardelle Anton on Wednesday April 14th 2021 and the patient stated that there was not any fever or chills and not any nausea but was hanging in there and last night the nerve block wore off about 2:45 am and it was brutally and muscle spasms but could have been coming from my MS on the left and patient stated that she was doing a lot better today and I stated to call the office if any concerns or questions and that we would see her next week for a post op visit. Kelli Pratt

## 2019-09-09 ENCOUNTER — Ambulatory Visit (INDEPENDENT_AMBULATORY_CARE_PROVIDER_SITE_OTHER): Payer: Medicare HMO | Admitting: Podiatry

## 2019-09-09 ENCOUNTER — Other Ambulatory Visit: Payer: Self-pay

## 2019-09-09 ENCOUNTER — Encounter: Payer: Self-pay | Admitting: Podiatry

## 2019-09-09 ENCOUNTER — Ambulatory Visit (INDEPENDENT_AMBULATORY_CARE_PROVIDER_SITE_OTHER): Payer: Medicare HMO

## 2019-09-09 VITALS — BP 125/76 | HR 85 | Temp 97.2°F | Resp 14

## 2019-09-09 DIAGNOSIS — M205X2 Other deformities of toe(s) (acquired), left foot: Secondary | ICD-10-CM

## 2019-09-09 DIAGNOSIS — B351 Tinea unguium: Secondary | ICD-10-CM

## 2019-09-09 DIAGNOSIS — M7752 Other enthesopathy of left foot: Secondary | ICD-10-CM

## 2019-09-09 NOTE — Patient Instructions (Signed)
Continue soaking in epsom salts twice a day followed by antibiotic ointment and a band-aid. Can leave uncovered at night. Continue this until completely healed.  Monitor for any signs/symptoms of infection. Call the office immediately if any occur or go directly to the emergency room. Call with any questions/concerns.  

## 2019-09-10 NOTE — Progress Notes (Signed)
Subjective: Kelli Pratt is a 69 y.o. is seen today in office s/p left foot first MPJ arthroplasty with implant as well as right total hallux nail avulsion preformed on 09/04/2019.  Overall states that she is doing well.  The first 2 days she had quite a bit of pain but this did resolve.  She is not taking any pain medicine currently.  The right big toe she has been soaking daily but does burn the Epson salts.  She has noticed clear drainage but no pus.  Denies any systemic complaints such as fevers, chills, nausea, vomiting. No calf pain, chest pain, shortness of breath.   Objective: General: No acute distress, AAOx3  DP/PT pulses palpable 2/4, CRT < 3 sec to all digits.  Protective sensation intact. Motor function intact.  LEFT foot: Incision is well coapted without any evidence of dehiscence. There is no mild surrounding erythema on the distal portion of the incision but there is no ascending cellulitis.  There is a erythematous blanchable.  There is no increase in warmth.  There is no drainage or pus coming from the incision.  There is no fluctuation or crepitation.  There is no malodor.  There is mild edema to the foot.  No pain with MPJ range of motion today. RIGHT foot: Status post total nail avulsion.  Granulation tissue present nail bed with tenderness palpation.  No edema, erythema or signs of infection.  No drainage or pus. No other areas of tenderness to bilateral lower extremities.  No other open lesions or pre-ulcerative lesions.  No pain with calf compression, swelling, warmth, erythema.   Assessment and Plan:  Status post left first MPJ implant arthroplasty and right hallux total nail avulsion, doing well with no complications   -Treatment options discussed including all alternatives, risks, and complications -X-rays obtained and reviewed.  Status post first MPJ arthroplasty with implant.  No evidence of acute fracture or complicating factors. -The wound was cleaned in the left  side.  A small amount of antibiotic ointment was applied followed by dressing.  I want her to remove the bandage on Thursday to make sure that the erythema is not worsening.  Finish course of antibiotics.  I do think the erythema is more from inflammation post surgery as opposed to infection or want to monitor closely. -Ice/elevation -Pain medication as needed. -Remain in cam boot -On the right side continue soaking and she can switch to antibacterial soap soaks as well as Epson salts as burning.  Antibiotic ointment and a bandage in the day.  Can leave the area open at night. -Monitor for any clinical signs or symptoms of infection and DVT/PE and directed to call the office immediately should any occur or go to the ER. -Follow-up as scheduled or sooner if any problems arise. In the meantime, encouraged to call the office with any questions, concerns, change in symptoms.   Ovid Curd, DPM

## 2019-09-12 ENCOUNTER — Encounter: Payer: Self-pay | Admitting: Podiatry

## 2019-09-14 ENCOUNTER — Other Ambulatory Visit: Payer: Self-pay | Admitting: Internal Medicine

## 2019-09-19 ENCOUNTER — Other Ambulatory Visit: Payer: Self-pay

## 2019-09-19 ENCOUNTER — Ambulatory Visit (INDEPENDENT_AMBULATORY_CARE_PROVIDER_SITE_OTHER): Payer: Medicare HMO | Admitting: Podiatry

## 2019-09-19 ENCOUNTER — Encounter: Payer: Self-pay | Admitting: Podiatry

## 2019-09-19 VITALS — Temp 96.4°F

## 2019-09-19 DIAGNOSIS — M205X2 Other deformities of toe(s) (acquired), left foot: Secondary | ICD-10-CM

## 2019-09-19 DIAGNOSIS — M7752 Other enthesopathy of left foot: Secondary | ICD-10-CM

## 2019-09-19 DIAGNOSIS — B351 Tinea unguium: Secondary | ICD-10-CM

## 2019-09-20 NOTE — Progress Notes (Signed)
Subjective: Kelli Pratt is a 69 y.o. is seen today in office s/p left foot first MPJ arthroplasty with implant as well as right total hallux nail avulsion preformed on 09/04/2019.  She says overall she is doing better.  The redness has resolved.  She states that in the morning when she first gets up the toes are in good alignment and then throughout the day she gets some swelling the big toe and second toe does separate some.  She is been working the range of motion of the big toe she is having no pain. Denies any systemic complaints such as fevers, chills, nausea, vomiting. No calf pain, chest pain, shortness of breath.   Objective: General: No acute distress, AAOx3  DP/PT pulses palpable 2/4, CRT < 3 sec to all digits.  Protective sensation intact. Motor function intact.  LEFT foot: Incision is well coapted without any evidence of dehiscence.  Area of erythema has resolved.  There is no surrounding erythema, ascending cellulitis.  There is no fluctuation or crepitation but there is no malodor.  Incision is well coapted without any signs of infection or dehiscence.  There is no pain with MPJ range of motion appears to be adequate. Right hallux toenail is healed. No other areas of tenderness to bilateral lower extremities.  No other open lesions or pre-ulcerative lesions.  No pain with calf compression, swelling, warmth, erythema.   Assessment and Plan:  Status post left first MPJ implant arthroplasty and right hallux total nail avulsion, doing well with no complications   -Treatment options discussed including all alternatives, risks, and complications -At this time she can start to shower.  Wash the incision with soap and water and dry thoroughly.  She can apply a small amount of antibiotic ointment daily.  Continue to work range of motion exercises and remain in the cam boot during the day but she wear surgical shoe at home.  This was dispensed today. -Continue bandage during the day on  the right foot avoiding shoes and socks.  Otherwise she remove the area open.  Monitoring signs or symptoms of infection.  No follow-ups on file.  Vivi Barrack DPM

## 2019-10-03 ENCOUNTER — Ambulatory Visit (INDEPENDENT_AMBULATORY_CARE_PROVIDER_SITE_OTHER): Payer: Medicare HMO | Admitting: Podiatry

## 2019-10-03 ENCOUNTER — Encounter: Payer: Self-pay | Admitting: Podiatry

## 2019-10-03 ENCOUNTER — Other Ambulatory Visit: Payer: Self-pay

## 2019-10-03 DIAGNOSIS — M205X2 Other deformities of toe(s) (acquired), left foot: Secondary | ICD-10-CM

## 2019-10-03 DIAGNOSIS — M7752 Other enthesopathy of left foot: Secondary | ICD-10-CM

## 2019-10-04 ENCOUNTER — Other Ambulatory Visit: Payer: Self-pay | Admitting: Internal Medicine

## 2019-10-09 NOTE — Progress Notes (Signed)
Subjective: Kelli Pratt is a 69 y.o. is seen today in office s/p left foot first MPJ arthroplasty with implant as well as right total hallux nail avulsion preformed on 09/04/2019.  States that she is doing better.  She has had no pain.  She is still in the cam boot but eager to get back into regular shoe.  No recent injury or falls. Denies any systemic complaints such as fevers, chills, nausea, vomiting. No calf pain, chest pain, shortness of breath.   Objective: General: No acute distress, AAOx3  DP/PT pulses palpable 2/4, CRT < 3 sec to all digits.  Protective sensation intact. Motor function intact.  LEFT foot: Incision is well coapted without any evidence of dehiscence with a scar formed.  There is no pain with MPJ range of motion.  Mild edema.  No erythema or warmth.  Overall the edema appears to be improving.  No tenderness palpation at surgical site. No other areas of tenderness to bilateral lower extremities.  No other open lesions or pre-ulcerative lesions.  No pain with calf compression, swelling, warmth, erythema.   Assessment and Plan:  Status post left first MPJ implant arthroplasty-doing well  -Treatment options discussed including all alternatives, risks, and complications -Discussed with this time she can slowly start to transition back into regular shoe.  Discussed continue with range of motion exercises, ice.  Hold off on exercise until back into regular shoe full-time without any discomfort.  Return in about 3 weeks (around 10/24/2019).  Vivi Barrack DPM

## 2019-10-24 ENCOUNTER — Ambulatory Visit (INDEPENDENT_AMBULATORY_CARE_PROVIDER_SITE_OTHER): Payer: Medicare HMO | Admitting: Podiatry

## 2019-10-24 ENCOUNTER — Other Ambulatory Visit: Payer: Self-pay

## 2019-10-24 ENCOUNTER — Encounter: Payer: Self-pay | Admitting: Podiatry

## 2019-10-24 DIAGNOSIS — M205X2 Other deformities of toe(s) (acquired), left foot: Secondary | ICD-10-CM

## 2019-10-24 DIAGNOSIS — M7752 Other enthesopathy of left foot: Secondary | ICD-10-CM

## 2019-10-28 ENCOUNTER — Encounter: Payer: Self-pay | Admitting: Podiatry

## 2019-10-28 NOTE — Progress Notes (Signed)
Subjective: Kelli Pratt is a 69 y.o. is seen today in office s/p left foot first MPJ arthroplasty with implant as well as right total hallux nail avulsion preformed on 09/04/2019.  Overall she states that she is doing well.  She is having no significant pain currently she is still in the cam boot.  She is eager to get back into regular shoe and trying to get back to normal activities.  No recent injury or trauma.  She does get some slight swelling and discomfort but overall minimal. Denies any systemic complaints such as fevers, chills, nausea, vomiting. No calf pain, chest pain, shortness of breath.   Objective: General: No acute distress, AAOx3  DP/PT pulses palpable 2/4, CRT < 3 sec to all digits.  Protective sensation intact. Motor function intact.  LEFT foot: Incision is well coapted without any evidence of dehiscence with a scar formed.  There is no pain with MPJ range of motion.  There is no significant discomfort identified to the surgical site.  There is trace edema.  Is no erythema or warmth.  No other areas of tenderness to bilateral lower extremities.  No other open lesions or pre-ulcerative lesions.  No pain with calf compression, swelling, warmth, erythema.   Assessment and Plan:  Status post left first MPJ implant arthroplasty-doing well  -Treatment options discussed including all alternatives, risks, and complications -At this time she can transition back into regular shoe as tolerated and gradually increase activity levels.  Discussed starting exercise and getting back to more normal activities.  Discussed holding off on high-impact activity but she can start to increase her walking as well as using elliptical.  Return in about 6 weeks (around 12/05/2019).  Vivi Barrack DPM

## 2019-11-15 ENCOUNTER — Other Ambulatory Visit: Payer: Self-pay | Admitting: Internal Medicine

## 2019-11-29 DIAGNOSIS — N183 Chronic kidney disease, stage 3 unspecified: Secondary | ICD-10-CM | POA: Diagnosis not present

## 2019-11-29 DIAGNOSIS — R8290 Unspecified abnormal findings in urine: Secondary | ICD-10-CM | POA: Diagnosis not present

## 2019-11-29 DIAGNOSIS — E785 Hyperlipidemia, unspecified: Secondary | ICD-10-CM | POA: Diagnosis not present

## 2019-11-29 DIAGNOSIS — E559 Vitamin D deficiency, unspecified: Secondary | ICD-10-CM | POA: Diagnosis not present

## 2019-11-29 DIAGNOSIS — Z0000 Encounter for general adult medical examination without abnormal findings: Secondary | ICD-10-CM | POA: Diagnosis not present

## 2019-12-05 ENCOUNTER — Other Ambulatory Visit: Payer: Self-pay

## 2019-12-05 ENCOUNTER — Ambulatory Visit (INDEPENDENT_AMBULATORY_CARE_PROVIDER_SITE_OTHER): Payer: Medicare HMO | Admitting: Podiatry

## 2019-12-05 DIAGNOSIS — M7752 Other enthesopathy of left foot: Secondary | ICD-10-CM

## 2019-12-05 DIAGNOSIS — M205X2 Other deformities of toe(s) (acquired), left foot: Secondary | ICD-10-CM

## 2019-12-06 DIAGNOSIS — R32 Unspecified urinary incontinence: Secondary | ICD-10-CM | POA: Diagnosis not present

## 2019-12-06 DIAGNOSIS — E7849 Other hyperlipidemia: Secondary | ICD-10-CM | POA: Diagnosis not present

## 2019-12-06 DIAGNOSIS — R61 Generalized hyperhidrosis: Secondary | ICD-10-CM | POA: Diagnosis not present

## 2019-12-06 DIAGNOSIS — G35 Multiple sclerosis: Secondary | ICD-10-CM | POA: Diagnosis not present

## 2019-12-06 DIAGNOSIS — Z0000 Encounter for general adult medical examination without abnormal findings: Secondary | ICD-10-CM | POA: Diagnosis not present

## 2019-12-06 DIAGNOSIS — E559 Vitamin D deficiency, unspecified: Secondary | ICD-10-CM | POA: Diagnosis not present

## 2019-12-06 DIAGNOSIS — I2510 Atherosclerotic heart disease of native coronary artery without angina pectoris: Secondary | ICD-10-CM | POA: Diagnosis not present

## 2019-12-06 DIAGNOSIS — F5104 Psychophysiologic insomnia: Secondary | ICD-10-CM | POA: Diagnosis not present

## 2019-12-06 DIAGNOSIS — F329 Major depressive disorder, single episode, unspecified: Secondary | ICD-10-CM | POA: Diagnosis not present

## 2019-12-10 NOTE — Progress Notes (Signed)
Subjective: Kelli Pratt is a 69 y.o. is seen today in office s/p left foot first MPJ arthroplasty with implant as well as right total hallux nail avulsion preformed on 09/04/2019.  She states that she is doing well.  She is back to more normal activities of any significant discomfort.  Some occasional swelling but no increased pain.  She has no new concerns today. Denies any systemic complaints such as fevers, chills, nausea, vomiting. No calf pain, chest pain, shortness of breath.   Objective: General: No acute distress, AAOx3  DP/PT pulses palpable 2/4, CRT < 3 sec to all digits.  Protective sensation intact. Motor function intact.  LEFT foot: Incision is well coapted without any evidence of dehiscence with a scar formed.  There is no pain or restriction with MPJ range of motion at this time.  There is no significant edema there is no erythema or warmth.  No there is discomfort identified.  The patient is having wound on the proximal first interspace has resolved.  No other open lesions or pre-ulcerative lesions.  No pain with calf compression, swelling, warmth, erythema.   Assessment and Plan:  Status post left first MPJ implant arthroplasty-doing well  -Treatment options discussed including all alternatives, risks, and complications -She is back to normal activity and she has been feeling better.  Continue with range of motion, supportive shoes and gradually returning to more complete normal activities.  Since she is doing well we will discharge her from the postoperative course at this time  Return if symptoms worsen or fail to improve.  Vivi Barrack DPM

## 2020-05-11 ENCOUNTER — Other Ambulatory Visit: Payer: Self-pay | Admitting: Internal Medicine

## 2020-06-04 DIAGNOSIS — M545 Low back pain, unspecified: Secondary | ICD-10-CM | POA: Diagnosis not present

## 2020-06-04 DIAGNOSIS — F5104 Psychophysiologic insomnia: Secondary | ICD-10-CM | POA: Diagnosis not present

## 2020-06-04 DIAGNOSIS — E785 Hyperlipidemia, unspecified: Secondary | ICD-10-CM | POA: Diagnosis not present

## 2020-06-04 DIAGNOSIS — G35 Multiple sclerosis: Secondary | ICD-10-CM | POA: Diagnosis not present

## 2020-06-04 DIAGNOSIS — I499 Cardiac arrhythmia, unspecified: Secondary | ICD-10-CM | POA: Diagnosis not present

## 2020-06-04 DIAGNOSIS — F329 Major depressive disorder, single episode, unspecified: Secondary | ICD-10-CM | POA: Diagnosis not present

## 2020-06-04 DIAGNOSIS — I2510 Atherosclerotic heart disease of native coronary artery without angina pectoris: Secondary | ICD-10-CM | POA: Diagnosis not present

## 2020-06-04 DIAGNOSIS — M1990 Unspecified osteoarthritis, unspecified site: Secondary | ICD-10-CM | POA: Diagnosis not present

## 2020-07-14 DIAGNOSIS — Z23 Encounter for immunization: Secondary | ICD-10-CM | POA: Diagnosis not present

## 2020-08-06 ENCOUNTER — Telehealth: Payer: Self-pay

## 2020-08-06 ENCOUNTER — Other Ambulatory Visit: Payer: Self-pay | Admitting: Internal Medicine

## 2020-08-06 NOTE — Telephone Encounter (Signed)
Called  patient to schedule appointment unable to contact Telephone number not in service.

## 2020-09-18 DIAGNOSIS — H35372 Puckering of macula, left eye: Secondary | ICD-10-CM | POA: Diagnosis not present

## 2020-09-18 DIAGNOSIS — H33199 Other retinoschisis and retinal cysts, unspecified eye: Secondary | ICD-10-CM | POA: Diagnosis not present

## 2020-09-18 DIAGNOSIS — H469 Unspecified optic neuritis: Secondary | ICD-10-CM | POA: Diagnosis not present

## 2020-09-18 DIAGNOSIS — H35342 Macular cyst, hole, or pseudohole, left eye: Secondary | ICD-10-CM | POA: Diagnosis not present

## 2020-11-30 DIAGNOSIS — Z79899 Other long term (current) drug therapy: Secondary | ICD-10-CM | POA: Diagnosis not present

## 2020-11-30 DIAGNOSIS — E785 Hyperlipidemia, unspecified: Secondary | ICD-10-CM | POA: Diagnosis not present

## 2020-11-30 DIAGNOSIS — I499 Cardiac arrhythmia, unspecified: Secondary | ICD-10-CM | POA: Diagnosis not present

## 2020-12-03 DIAGNOSIS — Z0000 Encounter for general adult medical examination without abnormal findings: Secondary | ICD-10-CM | POA: Diagnosis not present

## 2020-12-03 DIAGNOSIS — F5104 Psychophysiologic insomnia: Secondary | ICD-10-CM | POA: Diagnosis not present

## 2020-12-03 DIAGNOSIS — M1990 Unspecified osteoarthritis, unspecified site: Secondary | ICD-10-CM | POA: Diagnosis not present

## 2020-12-03 DIAGNOSIS — F329 Major depressive disorder, single episode, unspecified: Secondary | ICD-10-CM | POA: Diagnosis not present

## 2020-12-03 DIAGNOSIS — I499 Cardiac arrhythmia, unspecified: Secondary | ICD-10-CM | POA: Diagnosis not present

## 2020-12-03 DIAGNOSIS — H3530 Unspecified macular degeneration: Secondary | ICD-10-CM | POA: Diagnosis not present

## 2020-12-03 DIAGNOSIS — G35 Multiple sclerosis: Secondary | ICD-10-CM | POA: Diagnosis not present

## 2020-12-03 DIAGNOSIS — E785 Hyperlipidemia, unspecified: Secondary | ICD-10-CM | POA: Diagnosis not present

## 2020-12-03 DIAGNOSIS — I2510 Atherosclerotic heart disease of native coronary artery without angina pectoris: Secondary | ICD-10-CM | POA: Diagnosis not present

## 2021-06-10 ENCOUNTER — Encounter: Payer: Self-pay | Admitting: *Deleted

## 2021-06-14 ENCOUNTER — Telehealth: Payer: Self-pay | Admitting: Diagnostic Neuroimaging

## 2021-06-14 ENCOUNTER — Encounter: Payer: Self-pay | Admitting: Diagnostic Neuroimaging

## 2021-06-14 ENCOUNTER — Ambulatory Visit: Payer: Medicare HMO | Admitting: Diagnostic Neuroimaging

## 2021-06-14 VITALS — BP 148/90 | HR 68 | Ht 66.0 in | Wt 123.6 lb

## 2021-06-14 DIAGNOSIS — M5412 Radiculopathy, cervical region: Secondary | ICD-10-CM

## 2021-06-14 DIAGNOSIS — M542 Cervicalgia: Secondary | ICD-10-CM | POA: Diagnosis not present

## 2021-06-14 MED ORDER — GABAPENTIN 100 MG PO CAPS: 100.0000 mg | ORAL_CAPSULE | Freq: Three times a day (TID) | ORAL | 6 refills | Status: DC

## 2021-06-14 NOTE — Progress Notes (Signed)
[facility]  PATIENT: Kelli Pratt DOB: Apr 13, 1951  REFERRING CLINICIAN: Reynold Bowen, MD HISTORY FROM: patient  REASON FOR VISIT: new consult    HISTORICAL  CHIEF COMPLAINT:  Chief Complaint  Patient presents with   Multiple Sclerosis    Rm 7 New Pt, diagnosed in 1975 "cervical radiculopathy-getting worse"    HISTORY OF PRESENT ILLNESS:   71 year old female here for evaluation of neck pain.  Patient has history of optic neuritis in 1985, diagnosed with multiple sclerosis.  She was treated with Betaseron and Copaxone.  She was last treated with disease modifying therapy in 2008.  Then patient stopped therapy because of lack of benefit, side effects and cost.  She had an episode of left optic neuritis in 2017 treated with steroids.  She has not wanted to pursue additional MRI or MS therapy since that time.  Patient also has chronic left neck pain radiating to her left shoulder.  This has been intermittent flared up sporadically over many years.  This has significantly worsened in the last 3 to 6 months.   REVIEW OF SYSTEMS: Full 14 system review of systems performed and negative with exception of: as per HPI.  ALLERGIES: Allergies  Allergen Reactions   Versed [Midazolam]     HOME MEDICATIONS: Outpatient Medications Prior to Visit  Medication Sig Dispense Refill   aspirin EC 81 MG tablet Take 1 tablet (81 mg total) by mouth daily.     clonazePAM (KLONOPIN) 0.5 MG tablet Take 1 tablet by mouth 3 (three) times daily.     desvenlafaxine (PRISTIQ) 50 MG 24 hr tablet TAKE ONE TABLE BY MOUTH DAILY  5   estradiol (ESTRACE) 0.5 MG tablet Take 0.5 mg by mouth daily.  2   meloxicam (MOBIC) 15 MG tablet TAKE 1 TABLET BY MOUTH EVERY DAY 30 tablet 0   MYRBETRIQ 50 MG TB24 tablet Take 50 mg by mouth at bedtime. (Patient not taking: Reported on 06/14/2021)  2   NONFORMULARY OR COMPOUNDED ITEM [facility]:  Antifungal Cream - Terbinafine 3%, Fluconazole  2%, Tea Tree Oil 5%, Urea 10%, Ibuprofen 2% in DMSO #25m suspension. Apply to affected toenail(s) once (at bedtime) or twice daily. 30 each 5   oxyCODONE-acetaminophen (PERCOCET/ROXICET) 5-325 MG tablet Take 1-2 tablets by mouth every 6 (six) hours as needed for severe pain. 20 tablet 0   promethazine (PHENERGAN) 25 MG tablet Take 1 tablet (25 mg total) by mouth every 8 (eight) hours as needed for nausea or vomiting. 20 tablet 0   rosuvastatin (CRESTOR) 5 MG tablet TAKE 1 TABLET BY MOUTH EVERY OTHER DAY 45 tablet 0   Vitamin D, Ergocalciferol, (DRISDOL) 50000 units CAPS capsule TAKE ONE (1) CAPSULE BY MOUTH ONCE (1X) A WEEK  2   cephALEXin (KEFLEX) 500 MG capsule Take 1 capsule (500 mg total) by mouth 3 (three) times daily. 21 capsule 0   metoprolol tartrate (LOPRESSOR) 50 MG tablet Take 1 tablet (559m 2 hours prior to your Cardiac CT 1 tablet 0   promethazine (PHENERGAN) 25 MG tablet Take 1 tablet (25 mg total) by mouth every 8 (eight) hours as needed for nausea or vomiting. 20 tablet 0   No facility-administered medications prior to visit.    PAST MEDICAL HISTORY: Past Medical History:  Diagnosis Date   Depression    MS (multiple sclerosis) (HCEddystone   OA (osteoarthritis)    Syncope and collapse     PAST SURGICAL HISTORY: Past Surgical History:  Procedure Laterality Date  BREAST BIOPSY Bilateral    BREAST EXCISIONAL BIOPSY Left    BREAST LUMPECTOMY Left    5 lumps removed, benign, fiborcystic areas removed    BUNIONECTOMY Left 2019   cataracts     REPLACEMENT TOTAL KNEE Bilateral    TOTAL ABDOMINAL HYSTERECTOMY      FAMILY HISTORY: Family History  Problem Relation Age of Onset   Hyperlipidemia Mother    Multiple myeloma Mother    Heart attack Father    Dementia Father     SOCIAL HISTORY: Social History   Socioeconomic History   Marital status: Married    Spouse name: Dominica Severin   Number of children: 1   Years of education: Not on file   Highest education level:  Bachelor's degree (e.g., BA, AB, BS)  Occupational History    Comment: retired Marine scientist  Tobacco Use   Smoking status: Never   Smokeless tobacco: Never  Substance and Sexual Activity   Alcohol use: Not Currently   Drug use: Never   Sexual activity: Not on file  Other Topics Concern   Not on file  Social History Narrative   Lives with husband   Social Determinants of Health   Financial Resource Strain: Not on file  Food Insecurity: Not on file  Transportation Needs: Not on file  Physical Activity: Not on file  Stress: Not on file  Social Connections: Not on file  Intimate Partner Violence: Not on file     PHYSICAL EXAM  GENERAL EXAM/CONSTITUTIONAL: Vitals:  Vitals:   06/14/21 1112 06/14/21 1120  BP: (!) 173/96 (!) 148/90  Pulse: 68 68  Weight: 123 lb 9.6 oz (56.1 kg)   Height: '5\' 6"'  (1.676 m)    Body mass index is 19.95 kg/m. Wt Readings from Last 3 Encounters:  06/14/21 123 lb 9.6 oz (56.1 kg)  05/07/19 124 lb 9.6 oz (56.5 kg)  12/17/18 129 lb (58.5 kg)   Patient is in no distress; well developed, nourished and groomed; slightly decr om in neck; neg lhermitte's  CARDIOVASCULAR: Examination of carotid arteries is normal; no carotid bruits Regular rate and rhythm, no murmurs Examination of peripheral vascular system by observation and palpation is normal  EYES: Ophthalmoscopic exam of optic discs and posterior segments is normal; no papilledema or hemorrhages No results found.  MUSCULOSKELETAL: Gait, strength, tone, movements noted in Neurologic exam below  NEUROLOGIC: MENTAL STATUS:  No flowsheet data found. awake, alert, oriented to person, place and time recent and remote memory intact normal attention and concentration language fluent, comprehension intact, naming intact fund of knowledge appropriate  CRANIAL NERVE:  2nd - no papilledema on fundoscopic exam 2nd, 3rd, 4th, 6th - pupils equal and reactive to light, visual fields full to  confrontation, extraocular muscles intact, no nystagmus 5th - facial sensation symmetric 7th - facial strength symmetric 8th - hearing intact 9th - palate elevates symmetrically, uvula midline 11th - shoulder shrug symmetric 12th - tongue protrusion midline  MOTOR:  normal bulk and tone, full strength in the BUE, BLE; EXCEPT DECR IN LUE (4+) AND LLE (4+)  SENSORY:  normal and symmetric to light touch, temperature, vibration  COORDINATION:  finger-nose-finger, fine finger movements normal  REFLEXES:  deep tendon reflexes present and symmetric  GAIT/STATION:  narrow based gait     DIAGNOSTIC DATA (LABS, IMAGING, TESTING) - I reviewed patient records, labs, notes, testing and imaging myself where available.  No results found for: WBC, HGB, HCT, MCV, PLT    Component Value Date/Time   NA  143 07/23/2018 1122   K 5.2 07/23/2018 1122   CL 107 (H) 07/23/2018 1122   CO2 21 07/23/2018 1122   GLUCOSE 93 07/23/2018 1122   BUN 16 07/23/2018 1122   CREATININE 0.90 07/23/2018 1122   CALCIUM 9.9 07/23/2018 1122   GFRNONAA 66 07/23/2018 1122   GFRAA 76 07/23/2018 1122   Lab Results  Component Value Date   CHOL 180 04/15/2019   HDL 67 04/15/2019   LDLCALC 94 04/15/2019   TRIG 107 04/15/2019   CHOLHDL 2.7 04/15/2019   No results found for: HGBA1C No results found for: VITAMINB12 No results found for: TSH     ASSESSMENT AND PLAN  71 y.o. year old female here with:   Dx:  1. Neck pain   2. Left cervical radiculopathy      PLAN:  LEFT NECK PAIN (cervical radiculopathy) - refer to PT; consider massage and acupuncture - consider gabapentin 137m at bedtime for pain (may increase up to 1075mthree times a day) - check MRI cervical spine (MS eval; left cervical radiculopathy)  MULTIPLE SCLEROSIS (dx'd 1985; previously on betaseron and copaxone) - consider restarting disease modifying therapy; if patient agrees then would consider MRI brain and lab testing before  restarting therapy  Orders Placed This Encounter  Procedures   MR CERVICAL SPINE W WO CONTRAST   Ambulatory referral to Physical Therapy   Meds ordered this encounter  Medications   gabapentin (NEURONTIN) 100 MG capsule    Sig: Take 1 capsule (100 mg total) by mouth 3 (three) times daily.    Dispense:  90 capsule    Refill:  6   Return for pending if symptoms worsen or fail to improve, pending test results.    VIPenni BombardMD 1/0/01/[id number] Certified in Neurology, Neurophysiology and Neuroimaging  [facility] [address] [id number]

## 2021-06-14 NOTE — Telephone Encounter (Signed)
Kelli Pratt: [id number] (exp. 06/14/21 to 07/14/21) order sent to GI, they will reach out to the patient to schedule.

## 2021-06-14 NOTE — Patient Instructions (Signed)
LEFT NECK PAIN - refer to PT; consider massage and acupuncture - consider gabapentin 100mg  at bedtime for pain (may increase up to 100mg  three times a day) - check MRI cervical spine (MS eval; left cervical radiculopathy)  MULTIPLE SCLEROSIS - consider restarting disease modifying therapy

## 2021-06-16 ENCOUNTER — Ambulatory Visit
Admission: RE | Admit: 2021-06-16 | Discharge: 2021-06-16 | Disposition: A | Discharge status: Home / Self Care | Payer: Medicare HMO | Source: Ambulatory Visit | Attending: Diagnostic Neuroimaging | Admitting: Diagnostic Neuroimaging

## 2021-06-16 DIAGNOSIS — M5412 Radiculopathy, cervical region: Secondary | ICD-10-CM | POA: Diagnosis not present

## 2021-06-16 DIAGNOSIS — M542 Cervicalgia: Secondary | ICD-10-CM

## 2021-06-16 MED ORDER — GADOBENATE DIMEGLUMINE 529 MG/ML IV SOLN
11.0000 mL | Freq: Once | INTRAVENOUS | Status: AC | PRN
  Administered 2021-06-16: 11 mL via INTRAVENOUS

## 2021-06-18 DIAGNOSIS — R911 Solitary pulmonary nodule: Secondary | ICD-10-CM | POA: Diagnosis not present

## 2021-06-18 DIAGNOSIS — H3530 Unspecified macular degeneration: Secondary | ICD-10-CM | POA: Diagnosis not present

## 2021-06-18 DIAGNOSIS — F329 Major depressive disorder, single episode, unspecified: Secondary | ICD-10-CM | POA: Diagnosis not present

## 2021-06-18 DIAGNOSIS — G35 Multiple sclerosis: Secondary | ICD-10-CM | POA: Diagnosis not present

## 2021-06-18 DIAGNOSIS — E559 Vitamin D deficiency, unspecified: Secondary | ICD-10-CM | POA: Diagnosis not present

## 2021-06-18 DIAGNOSIS — E785 Hyperlipidemia, unspecified: Secondary | ICD-10-CM | POA: Diagnosis not present

## 2021-06-18 DIAGNOSIS — I499 Cardiac arrhythmia, unspecified: Secondary | ICD-10-CM | POA: Diagnosis not present

## 2021-06-18 DIAGNOSIS — I2510 Atherosclerotic heart disease of native coronary artery without angina pectoris: Secondary | ICD-10-CM | POA: Diagnosis not present

## 2021-06-18 DIAGNOSIS — F5104 Psychophysiologic insomnia: Secondary | ICD-10-CM | POA: Diagnosis not present

## 2021-06-21 ENCOUNTER — Telehealth: Payer: Self-pay | Admitting: *Deleted

## 2021-06-21 NOTE — Telephone Encounter (Signed)
Called patient, LVM informing her the MRI cervical spine showed Degenerative findings at multiple levels. She may consider PT, acupuncture and conservative management vs spine surgery consult. Requested she call back or send my chart advising of what she'd like to do.

## 2021-07-01 ENCOUNTER — Ambulatory Visit: Payer: Medicare HMO | Attending: Diagnostic Neuroimaging

## 2021-07-01 ENCOUNTER — Other Ambulatory Visit: Payer: Self-pay

## 2021-07-01 DIAGNOSIS — M542 Cervicalgia: Secondary | ICD-10-CM | POA: Diagnosis not present

## 2021-07-01 DIAGNOSIS — R252 Cramp and spasm: Secondary | ICD-10-CM | POA: Diagnosis not present

## 2021-07-01 DIAGNOSIS — R293 Abnormal posture: Secondary | ICD-10-CM | POA: Diagnosis not present

## 2021-07-01 DIAGNOSIS — M5412 Radiculopathy, cervical region: Secondary | ICD-10-CM | POA: Insufficient documentation

## 2021-07-01 NOTE — Therapy (Signed)
OUTPATIENT PHYSICAL THERAPY CERVICAL EVALUATION   Patient Name: Kelli Pratt MRN: [id number] DOB:1950-10-27, 71 y.o., female Today's Date: 07/02/2021   PT End of Session - 07/02/21 0544     Visit Number 1    Number of Visits 5    Date for PT Re-Evaluation 09/03/21    PT Start Time 1419    PT Stop Time 1505    PT Time Calculation (min) 46 min    Activity Tolerance Patient tolerated treatment well    Behavior During Therapy WFL for tasks assessed/performed             Past Medical History:  Diagnosis Date   Depression    MS (multiple sclerosis) (HCC)    OA (osteoarthritis)    Syncope and collapse    Past Surgical History:  Procedure Laterality Date   BREAST BIOPSY Bilateral    BREAST EXCISIONAL BIOPSY Left    BREAST LUMPECTOMY Left    5 lumps removed, benign, fiborcystic areas removed    BUNIONECTOMY Left 2019   cataracts     REPLACEMENT TOTAL KNEE Bilateral    TOTAL ABDOMINAL HYSTERECTOMY     Patient Active Problem List   Diagnosis Date Noted   Chest pain 06/27/2018   SOB (shortness of breath) 06/27/2018   Onychomycosis 06/19/2018   Hallux limitus of left foot 05/13/2018    PCP: Adrian Prince, MD  REFERRING PROVIDER: Suanne Marker, MD  REFERRING DIAG: Neck pain; Left cervical radiculopathy  THERAPY DIAG:  Cervicalgia  Cramp and spasm  Abnormal posture  ONSET DATE: 10+ years  SUBJECTIVE:                                                                                                                                                                                                         SUBJECTIVE STATEMENT: Pt reports having problems with her neck for 10+ years, but it has been getting wrose over the last 6 months.  Pt notes L lower neck/upper trap pain that starts as a burning sensation and can radiate to her L lateral shoulder/arm, but not below her elbow. Pt reports she exercises on a elliptical daily.  PERTINENT HISTORY:  OA, MS,  depression  PAIN:  Are you having pain? Yes NPRS scale: 2/10; 0-8/10 Pain location: L lower neck Pain orientation: Left  PAIN TYPE: burning Pain description: intermittent  Aggravating factors: Standing and looking down ie cooking, walking at a slow pace, elliptical Relieving factors: Gabapentin  PRECAUTIONS: None  WEIGHT BEARING RESTRICTIONS No  FALLS:  Has patient fallen in last 6  months? 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  C4-5 also shows moderate left and mild right-sided foraminal and canal narrowing.  PATIENT SURVEYS:  FOTO 57%  perceived functional status; Predicted post PT 62%   COGNITION: Overall cognitive status: Within functional limits for tasks assessed   SENSATION: Light touch: Appears intact  POSTURE:  Moderate FH, rounded shoulders, increased thoracic kyphosis and lumbar lordosis  PALPATION: Marked increase muscle tightness and TPs of the upper trap with TTP.   CERVICAL AROM/PROM  A/PROM A/PROM (deg) 07/02/2021  Flexion 55 c neck upper shoulder tightness  Extension 45 c tightness and pressure  Right lateral flexion 28 c L neck and upper shoulder tightness  Left lateral flexion 35 d c L neck and upper shoulder pressure  Right rotation 65 c L neck and  upper shoulder tightness  Left rotation 40 c L neck and upper pain and pressure   (Blank rows = not tested)  UE AROM/PROM: UE are grossly WNLs  UE MMT: UE myotomal screen negative  CERVICAL SPECIAL TESTS:  Spurling's test: Positive L  EVAL  TREATMENT 07/01/21:  Access Code: [email or web address] URL: [email or web address] Date: 07/02/2021 Prepared by: Joellyn Rued  Exercises Seated Scapular Retraction reps - 10 reps - 3 hold Seated Passive Cervical Retraction - 10 reps - 3 hold Standing Cervical Rotation AROM with Overpressure - 3 reps - 5 hold Seated Cervical Sidebending AROM - 3 reps - 5 hold    PATIENT EDUCATION:  Education details: Eval findings, POC, HEP, sleeping positions and support to assist with pain, use of tennis ball and theracane for mid back and upper shoulder massage Person educated: Patient Education method: Explanation, Demonstration, Tactile cues, Verbal cues, and Handouts Education comprehension: verbalized understanding, returned demonstration, verbal cues required, and tactile cues required   HOME EXERCISE PROGRAM: Access Code: [email or web address] URL: [email or web address] Date: 07/02/2021 Prepared by: Joellyn Rued  Exercises Seated Scapular Retraction - 6 x daily - 7 x weekly - 1 sets - 3-10 reps - 3 hold Seated Passive Cervical Retraction - 6 x daily - 7 x weekly - 1 sets - 3-10 reps - 3 hold Standing Cervical Rotation AROM with Overpressure - 6 x daily - 7 x weekly - 1 sets - 3-5 reps - 5 hold Seated Cervical Sidebending AROM - 6 x daily - 7 x weekly - 1 sets - 3-5 reps - 5 hold   ASSESSMENT:  CLINICAL IMPRESSION: Patient is a 71 y.o. F who was seen today for physical therapy evaluation and treatment for Neck pain; Left cervical radiculopathy. Pt's symptoms are consistent with her MRI with degenerative changes and foraminal stenosis with decreased cervical ROM especially for L rotation and R lat flexion. Additionally, the pt's postural presentation is poor with increased muscle tightness of her upper shoulders with TPs. These impairments are limiting patient from cleaning, community activity, driving, meal prep, laundry, and exercise . Personal factors including Past/current experiences,  Time since onset of injury/illness/exacerbation, 1 comorbidity: MS, and   are also affecting patient's functional outcome. Patient will benefit from skilled PT to address above impairments  and improve overall function.  IMPAIRMENTS: decreased activity tolerance, decreased ROM, decreased strength, increased muscle spasms, postural dysfunction, and pain  REHAB POTENTIAL: Good  CLINICAL DECISION MAKING: Evolving/moderate complexity  EVALUATION COMPLEXITY: Moderate   GOALS:  SHORT TERM GOALS = LTGS  LONG TERM GOALS:   LTG Name Target Date Goal status  1 Pt will be Ind in a final HEP to maintain achieved LOF Baseline: 09/03/21 INITIAL  2 Pt will voice understanding of measures to assist with the reduction and management of pain Baseline: 09/03/21 INITIAL  3 Increase L rotation to 55d and R lat flexion to 35d for improved cervical function Baseline: 09/03/21 INITIAL  4 Improve FOTO to predicted value of 62% Baseline: 57% 09/03/21 INITIAL  5 Pt will report a decrease in her neck/upper shoulder pain to a range of 0-5/10 and /or decreased frequency Baseline:0-8/10 09/03/21 INITIAL   PLAN: PT FREQUENCY: 1x/week  PT DURATION: 10 weeks  PLANNED INTERVENTIONS: Therapeutic exercises, Therapeutic activity, Neuro Muscular re-education, Patient/Family education, Joint mobilization, Dry Needling, Electrical stimulation, Spinal mobilization, Cryotherapy, Moist heat, Taping, Traction, Ionotophoresis 4mg /ml Dexamethasone, and Manual therapy  PLAN FOR NEXT SESSION:  Assess response to HEP, progress therex as indicated, use modalities, TPDN, and manual techniques as indicated.   Davan Nawabi MS, PT 07/02/21 12:13 PM   Referring diagnosis? Neck pain; Left cervical radiculopathy Treatment diagnosis? (if different than referring diagnosis) Cervicalgia, Cramp and spasm, Abnormal posture What was this (referring dx) caused by? []  Surgery []  Fall [x]  Ongoing issue [x]  Arthritis [x]  Other:  ___posture_________  Laterality: []  Rt []  Lt [x]  Both  Check all possible CPT codes:  *CHOOSE 10 OR LESS*    [x]  08/30/21 (Therapeutic Exercise)  []  92507 (SLP Treatment)  []  97112 (Neuro Re-ed)   []  92526 (Swallowing Treatment)   []  97116 (Gait Training)   []  (Cognitive Training, 1st 15 minutes) []  97140 (Manual Therapy)   []  97130 (Cognitive Training, each add'l 15 minutes)  []  97530 (Therapeutic Activities)  []  Other, List CPT Code ____________    [x]  97535 (Self Care)       []  All codes above (97110 - 97535)  [x]  97012 (Mechanical Traction)  [x]  97014 (E-stim Unattended)  [x]  97032 (E-stim manual)  [x]  97033 (Ionto)  [x]  97035 (Ultrasound)  []  97760 (Orthotic Fit) []  97750 (Physical Performance Training) []  (Aquatic Therapy) []  97034 (Contrast Bath) []  (Paraffin) []  97597 (Wound Care 1st 20 sq cm) []  97598 (Wound Care each add'l 20 sq cm) []  97016 (Vasopneumatic Device) []  (Orthotic Training) []  (Prosthetic Training)

## 2021-07-14 NOTE — Therapy (Signed)
OUTPATIENT PHYSICAL THERAPY TREATMENT NOTE   Patient Name: Kelli Pratt MRN: [id number] DOB:1950/10/16, 71 y.o., female Today's Date: 07/15/2021  PCP: Adrian Prince, MD REFERRING PROVIDER: Suanne Marker, MD   PT End of Session - 07/15/21 1536     Visit Number 2    Number of Visits 5    Date for PT Re-Evaluation 09/03/21    PT Start Time 1333    PT Stop Time 1419    PT Time Calculation (min) 46 min    Activity Tolerance Patient tolerated treatment well    Behavior During Therapy WFL for tasks assessed/performed             Past Medical History:  Diagnosis Date   Depression    MS (multiple sclerosis) (HCC)    OA (osteoarthritis)    Syncope and collapse    Past Surgical History:  Procedure Laterality Date   BREAST BIOPSY Bilateral    BREAST EXCISIONAL BIOPSY Left    BREAST LUMPECTOMY Left    5 lumps removed, benign, fiborcystic areas removed    BUNIONECTOMY Left 2019   cataracts     REPLACEMENT TOTAL KNEE Bilateral    TOTAL ABDOMINAL HYSTERECTOMY     Patient Active Problem List   Diagnosis Date Noted   Chest pain 06/27/2018   SOB (shortness of breath) 06/27/2018   Onychomycosis 06/19/2018   Hallux limitus of left foot 05/13/2018    REFERRING DIAG: Neck pain; Left cervical radiculopathy  THERAPY DIAG:  Cervicalgia  Cramp and spasm  Abnormal posture  SUBJECTIVE:                                                                                                                                                                                                          SUBJECTIVE STATEMENT: Pt reports she is doing well with the HEP helping to manage pain.   PAIN:  Are you having pain? Yes NPRS scale: 1/10; 0-4/10 Pain location: L lower neck Pain orientation: Left  PAIN TYPE: burning Pain description: intermittent  Aggravating factors: Standing and looking down ie cooking, walking at a slow pace, elliptical Relieving factors:  Gabapentin  PERTINENT HISTORY:  OA, MS, depression   PRECAUTIONS: None   WEIGHT BEARING RESTRICTIONS No   FALLS:  Has patient fallen in last 6 months? No Number of falls: 0   LIVING ENVIRONMENT: Lives with: lives with their family No issue with accessing her home or mobility within it   OCCUPATION: Retired   PLOF: Independent   PATIENT GOALS: For the pain to decrease and  to know how to better manage the pain   OBJECTIVE:    *Unless otherwise noted all objective measures were captured on initial evaluation.    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  C4-5 also shows moderate left and mild right-sided foraminal and canal narrowing.   PATIENT SURVEYS:  FOTO 57%  perceived functional status; Predicted post PT 62%     COGNITION: Overall cognitive status: Within functional limits for tasks assessed            SENSATION: Light touch: Appears intact   POSTURE:  Moderate FH, rounded shoulders, increased thoracic kyphosis and lumbar lordosis   PALPATION: Marked increase muscle tightness and TPs of the upper trap with TTP.        CERVICAL AROM/PROM   A/PROM A/PROM (deg) 07/02/2021  Flexion 55 c neck upper shoulder tightness  Extension 45 c tightness and pressure  Right lateral flexion 28 c L neck and upper shoulder tightness  Left lateral flexion 35 d c L neck and upper shoulder pressure  Right rotation 65 c L neck and  upper shoulder tightness  Left rotation 40 c L neck and upper pain and pressure   (Blank rows = not tested)   UE AROM/PROM: UE are grossly WNLs   UE MMT: UE myotomal screen negative   CERVICAL SPECIAL TESTS:  Spurling's test: Positive L  [facility] Adult PT Treatment:                                                DATE: 07/15/21 Therapeutic Exercise: Seated Scapular Retraction  reps - 10 reps - 3 hold Seated Passive Cervical Retraction - 10 reps - 3 hold Standing Cervical Rotation AROM with Overpressure - 3 reps - 5 hold Seated Cervical Sidebending AROM - 3 reps - 5 hold Seated thoracic ext x10 3" Pectoral stretch in doorway 90 and 120d x2 each 3" Shoulder hor abd 2x10 RTB Shoulder ER 2x10 RTB Updated HEP Manual Therapy: STM to the upper traps and levator Skilled palpation for the ID of taut muscles and TPs   Trigger Point Dry Needling Treatment: Pre-treatment instruction: Patient instructed on dry needling rationale, procedures, and possible side effects including pain during treatment (achy,cramping feeling), bruising, drop of blood, lightheadedness, nausea, sweating. Patient Consent Given: Yes Education handout provided: Yes Muscles treated: Upper trap and levator, L and R  Needle size and number: .30x35mm x 1 Electrical stimulation performed: No Parameters: N/A Treatment response/outcome: Twitch response elicited and Palpable decrease in muscle tension Post-treatment instructions: Patient instructed to expect possible mild to moderate muscle soreness later today and/or tomorrow. Patient instructed in methods to reduce muscle soreness and to continue prescribed HEP. If patient was dry needled over the lung field, patient was instructed on signs and symptoms of pneumothorax and, however unlikely, to see immediate medical attention should they occur. Patient was also educated on signs and symptoms of infection and to seek medical attention should they occur. Patient verbalized understanding of these instructions and education.   Trigger Point Dry Needling  What is Trigger Point Dry Needling (DN)? DN is a physical therapy technique used to treat muscle pain and dysfunction. Specifically, DN helps deactivate muscle trigger points (muscle knots).  A thin filiform  needle is used to penetrate the skin and stimulate the underlying trigger point. The goal is for a  local twitch response (LTR) to occur and for the trigger point to relax. No medication of any kind is injected during the procedure.   What Does Trigger Point Dry Needling Feel Like?  The procedure feels different for each individual patient. Some patients report that they do not actually feel the needle enter the skin and overall the process is not painful. Very mild bleeding may occur. However, many patients feel a deep cramping in the muscle in which the needle was inserted. This is the local twitch response.   How Will I feel after the treatment? Soreness is normal, and the onset of soreness may not occur for a few hours. Typically this soreness does not last longer than two days.  Bruising is uncommon, however; ice can be used to decrease any possible bruising.  In rare cases feeling tired or nauseous after the treatment is normal. In addition, your symptoms may get worse before they get better, this period will typically not last longer than 24 hours.   What Can I do After My Treatment? Increase your hydration by drinking more water for the next 24 hours. You may place ice or heat on the areas treated that have become sore, however, do not use heat on inflamed or bruised areas. Heat often brings more relief post needling. You can continue your regular activities, but vigorous activity is not recommended initially after the treatment for 24 hours. DN is best combined with other physical therapy such as strengthening, stretching, and other therapies.      EVAL TREATMENT 07/01/21:  Access Code: [email or web address] URL: [email or web address] Date: 07/02/2021 Prepared by: Joellyn Rued   Exercises Seated Scapular Retraction reps - 10 reps - 3 hold Seated Passive Cervical Retraction - 10 reps - 3 hold Standing Cervical Rotation AROM with Overpressure - 3 reps - 5 hold Seated Cervical Sidebending AROM - 3 reps - 5 hold       PATIENT EDUCATION:  Education details: Eval findings, POC,  HEP, sleeping positions and support to assist with pain, use of tennis ball and theracane for mid back and upper shoulder massage Person educated: Patient Education method: Explanation, Demonstration, Tactile cues, Verbal cues, and Handouts Education comprehension: verbalized understanding, returned demonstration, verbal cues required, and tactile cues required     HOME EXERCISE PROGRAM: Access Code: [email or web address] URL: [email or web address] Date: 07/15/2021 Prepared by: Joellyn Rued  Exercises Seated Scapular Retraction - 6 x daily - 7 x weekly - 1 sets - 3-10 reps - 3 hold Seated Passive Cervical Retraction - 6 x daily - 7 x weekly - 1 sets - 3-10 reps - 3 hold Standing Cervical Rotation AROM with Overpressure - 6 x daily - 7 x weekly - 1 sets - 3-5 reps - 5 hold Seated Cervical Sidebending AROM - 6 x daily - 7 x weekly - 1 sets - 3-5 reps - 5 hold Seated Thoracic Extension with Hands Behind Neck - 1 x daily - 7 x weekly - 1 sets - 10 reps - 3 hold Doorway Pec Stretch at 90 Degrees Abduction - 1 x daily - 7 x weekly - 1 sets - 3 reps - 30 hold Shoulder External Rotation and Scapular Retraction with Resistance - 1 x daily - 7 x weekly - 2 sets - 10 reps - 3 hold Standing Shoulder Horizontal Abduction with Resistance - 1 x daily - 7 x weekly - 2 sets -  10 reps - 3 hold      ASSESSMENT:   CLINICAL IMPRESSION: Pt returns to PT after the initial eval. Pt reports improvement in her L cervical and upper shoulder pain. Pt notes consistent completion of therex. With PT today, STM and TPDN was completed for the bilat upper traps and levator. This was f/b cervical and upper body stretches and strengthening to address tight muscle and address postural deficits. Pt completed the exs properly and they were added to her HEP. Pt tolerated the session without adverse effects. Pt will continue from PT to address deficits to improve neck function c less pain and improve  QOL.  IMPAIRMENTS: Decreased activity tolerance, decreased ROM, decreased strength, increased muscle spasms, postural dysfunction, and pain   REHAB POTENTIAL: Good   CLINICAL DECISION MAKING: Evolving/moderate complexity   EVALUATION COMPLEXITY: Moderate     GOALS:   SHORT TERM GOALS = LTGS   LONG TERM GOALS:    LTG Name Target Date Goal status  1 Pt will be Ind in a final HEP to maintain achieved LOF Baseline: 09/03/21 INITIAL  2 Pt will voice understanding of measures to assist with the reduction and management of pain Baseline: 09/03/21 INITIAL  3 Increase L rotation to 55d and R lat flexion to 35d for improved cervical function Baseline: 09/03/21 INITIAL  4 Improve FOTO to predicted value of 62% Baseline: 57% 09/03/21 INITIAL  5 Pt will report a decrease in her neck/upper shoulder pain to a range of 0-5/10 and /or decreased frequency Baseline:0-8/10 09/03/21 INITIAL    PLAN: PT FREQUENCY: 1x/week   PT DURATION: 10 weeks   PLANNED INTERVENTIONS: Therapeutic exercises, Therapeutic activity, Neuro Muscular re-education, Patient/Family education, Joint mobilization, Dry Needling, Electrical stimulation, Spinal mobilization, Cryotherapy, Moist heat, Taping, Traction, Ionotophoresis 4mg /ml Dexamethasone, and Manual therapy   PLAN FOR NEXT SESSION:  Assess response to HEP, progress therex as indicated, use modalities, TPDN, and manual techniques as indicated.    Raymonde Hamblin MS, PT 07/15/21 3:58 PM

## 2021-07-15 ENCOUNTER — Other Ambulatory Visit: Payer: Self-pay

## 2021-07-15 ENCOUNTER — Ambulatory Visit: Payer: Medicare HMO

## 2021-07-15 DIAGNOSIS — M542 Cervicalgia: Secondary | ICD-10-CM

## 2021-07-15 DIAGNOSIS — R293 Abnormal posture: Secondary | ICD-10-CM

## 2021-07-15 DIAGNOSIS — R252 Cramp and spasm: Secondary | ICD-10-CM

## 2021-07-15 DIAGNOSIS — M5412 Radiculopathy, cervical region: Secondary | ICD-10-CM | POA: Diagnosis not present

## 2021-07-15 NOTE — Patient Instructions (Signed)

## 2021-07-28 NOTE — Therapy (Signed)
OUTPATIENT PHYSICAL THERAPY TREATMENT NOTE   Patient Name: Kelli Pratt MRN: [id number] DOB:12-09-1950, 71 y.o., female Today's Date: 07/30/2021  PCP: Adrian Prince, MD REFERRING PROVIDER: Suanne Marker, MD   PT End of Session - 07/29/21 1337     Visit Number 3    Number of Visits 5    Date for PT Re-Evaluation 09/03/21    PT Start Time 1335    PT Stop Time 1415    PT Time Calculation (min) 40 min    Activity Tolerance Patient tolerated treatment well    Behavior During Therapy WFL for tasks assessed/performed              Past Medical History:  Diagnosis Date   Depression    MS (multiple sclerosis) (HCC)    OA (osteoarthritis)    Syncope and collapse    Past Surgical History:  Procedure Laterality Date   BREAST BIOPSY Bilateral    BREAST EXCISIONAL BIOPSY Left    BREAST LUMPECTOMY Left    5 lumps removed, benign, fiborcystic areas removed    BUNIONECTOMY Left 2019   cataracts     REPLACEMENT TOTAL KNEE Bilateral    TOTAL ABDOMINAL HYSTERECTOMY     Patient Active Problem List   Diagnosis Date Noted   Chest pain 06/27/2018   SOB (shortness of breath) 06/27/2018   Onychomycosis 06/19/2018   Hallux limitus of left foot 05/13/2018    REFERRING DIAG: Neck pain; Left cervical radiculopathy  THERAPY DIAG:  Cervicalgia  Cramp and spasm  Abnormal posture  SUBJECTIVE:                                                                                                                                                                                                          SUBJECTIVE STATEMENT: Pt reports her neck pain is about the same. Has good and bad days. She didn't notice a significant change after the TPDN.   PAIN:  Are you having pain? Yes NPRS scale: 1/10; 0-4/10 Pain location: L lower neck Pain orientation: Left  PAIN TYPE: burning Pain description: intermittent  Aggravating factors: Standing and looking down ie cooking, walking at a  slow pace, elliptical Relieving factors: Gabapentin  PERTINENT HISTORY:  OA, MS, depression   PRECAUTIONS: None   WEIGHT BEARING RESTRICTIONS No   FALLS:  Has patient fallen in last 6 months? No Number of falls: 0   LIVING ENVIRONMENT: Lives with: lives with their family No issue with accessing her home or mobility within it   OCCUPATION: Retired   PLOF:  Independent   PATIENT GOALS: For the pain to decrease and to know how to better manage the pain   OBJECTIVE:    *Unless otherwise noted all objective measures were captured on initial evaluation.    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  C4-5 also shows moderate left and mild right-sided foraminal and canal narrowing.   PATIENT SURVEYS:  FOTO 57%  perceived functional status; Predicted post PT 62%     COGNITION: Overall cognitive status: Within functional limits for tasks assessed            SENSATION: Light touch: Appears intact   POSTURE:  Moderate FH, rounded shoulders, increased thoracic kyphosis and lumbar lordosis   PALPATION: Marked increase muscle tightness and TPs of the upper trap with TTP.        CERVICAL AROM/PROM   A/PROM A/PROM (deg) 07/02/2021 A/PROM (deg) 07/29/2021  Flexion 55 c neck upper shoulder tightness   Extension 45 c tightness and pressure   Right lateral flexion 28 c L neck and upper shoulder tightness 32  Left lateral flexion 35 d c L neck and upper shoulder pressure 35  Right rotation 65 c L neck and  upper shoulder tightness 65  Left rotation 40 c L neck and upper pain and pressure 75   (Blank rows = not tested)   UE AROM/PROM: UE are grossly WNLs   UE MMT: UE myotomal screen negative   CERVICAL SPECIAL TESTS:  Spurling's test: Positive L  [facility] Adult PT Treatment:                                                 DATE: 07/29/21 Therapeutic Exercise: Seated Scapular Retraction reps - 10 reps - 3 hold Seated Passive Cervical Retraction - 10 reps - 3 hold Standing Cervical Rotation AROM with Overpressure - 3 reps - 5 hold Seated Cervical Sidebending AROM - 3 reps - 5 hold Seated thoracic ext x10 3" Pectoral stretch in doorway 90 and 120d x2 each 3" Updated HEP Manual Therapy: STM to the cervical paraspinals, upper traps, and levator c TPMR of the traps. Suboccipital release. Grade ll UPAs to C2-C6- no increase in pain   [facility] Adult PT Treatment:                                                DATE: 07/15/21 Therapeutic Exercise: Seated Scapular Retraction reps - 10 reps - 3 hold Seated Passive Cervical Retraction - 10 reps - 3 hold Standing Cervical Rotation AROM with Overpressure - 3 reps - 5 hold Seated Cervical Sidebending AROM - 3 reps - 5 hold Seated thoracic ext x10 3" Pectoral stretch in doorway 90 and 120d x2 each 3" Shoulder hor abd 2x10 RTB Shoulder ER 2x10 RTB Updated HEP Manual Therapy: STM to the upper traps and levator Skilled palpation for the ID of taut muscles and TPs   Trigger Point Dry Needling Treatment: Pre-treatment instruction: Patient instructed on dry needling rationale, procedures, and possible side effects including pain during treatment (achy,cramping feeling), bruising, drop of blood, lightheadedness, nausea, sweating. Patient Consent Given:  Yes Education handout provided: Yes Muscles treated: Upper trap and levator, L and R  Needle size and number: .30x44mm x 1 Electrical stimulation performed: No Parameters: N/A Treatment response/outcome: Twitch response elicited and Palpable decrease in muscle tension Post-treatment instructions: Patient instructed to expect possible mild to moderate muscle soreness later today and/or tomorrow. Patient instructed in methods to reduce muscle soreness and to continue prescribed HEP. If patient was dry needled over the  lung field, patient was instructed on signs and symptoms of pneumothorax and, however unlikely, to see immediate medical attention should they occur. Patient was also educated on signs and symptoms of infection and to seek medical attention should they occur. Patient verbalized understanding of these instructions and education.   Trigger Point Dry Needling  What is Trigger Point Dry Needling (DN)? DN is a physical therapy technique used to treat muscle pain and dysfunction. Specifically, DN helps deactivate muscle trigger points (muscle knots).  A thin filiform needle is used to penetrate the skin and stimulate the underlying trigger point. The goal is for a local twitch response (LTR) to occur and for the trigger point to relax. No medication of any kind is injected during the procedure.   What Does Trigger Point Dry Needling Feel Like?  The procedure feels different for each individual patient. Some patients report that they do not actually feel the needle enter the skin and overall the process is not painful. Very mild bleeding may occur. However, many patients feel a deep cramping in the muscle in which the needle was inserted. This is the local twitch response.   How Will I feel after the treatment? Soreness is normal, and the onset of soreness may not occur for a few hours. Typically this soreness does not last longer than two days.  Bruising is uncommon, however; ice can be used to decrease any possible bruising.  In rare cases feeling tired or nauseous after the treatment is normal. In addition, your symptoms may get worse before they get better, this period will typically not last longer than 24 hours.   What Can I do After My Treatment? Increase your hydration by drinking more water for the next 24 hours. You may place ice or heat on the areas treated that have become sore, however, do not use heat on inflamed or bruised areas. Heat often brings more relief post needling. You can  continue your regular activities, but vigorous activity is not recommended initially after the treatment for 24 hours. DN is best combined with other physical therapy such as strengthening, stretching, and other therapies.      EVAL TREATMENT 07/01/21:  Access Code: [email or web address] URL: [email or web address] Date: 07/02/2021 Prepared by: Joellyn Rued   Exercises Seated Scapular Retraction reps - 10 reps - 3 hold Seated Passive Cervical Retraction - 10 reps - 3 hold Standing Cervical Rotation AROM with Overpressure - 3 reps - 5 hold Seated Cervical Sidebending AROM - 3 reps - 5 hold       PATIENT EDUCATION:  Education details: Eval findings, POC, HEP, sleeping positions and support to assist with pain, use of tennis ball and theracane for mid back and upper shoulder massage Person educated: Patient Education method: Explanation, Demonstration, Tactile cues, Verbal cues, and Handouts Education comprehension: verbalized understanding, returned demonstration, verbal cues required, and tactile cues required     HOME EXERCISE PROGRAM: Access Code: [email or web address] URL: [email or web address] Date: 07/15/2021 Prepared by: Joellyn Rued  Exercises Seated Scapular Retraction - 6 x daily - 7 x weekly -  1 sets - 3-10 reps - 3 hold Seated Passive Cervical Retraction - 6 x daily - 7 x weekly - 1 sets - 3-10 reps - 3 hold Standing Cervical Rotation AROM with Overpressure - 6 x daily - 7 x weekly - 1 sets - 3-5 reps - 5 hold Seated Cervical Sidebending AROM - 6 x daily - 7 x weekly - 1 sets - 3-5 reps - 5 hold Seated Thoracic Extension with Hands Behind Neck - 1 x daily - 7 x weekly - 1 sets - 10 reps - 3 hold Doorway Pec Stretch at 90 Degrees Abduction - 1 x daily - 7 x weekly - 1 sets - 3 reps - 30 hold Shoulder External Rotation and Scapular Retraction with Resistance - 1 x daily - 7 x weekly - 2 sets - 10 reps - 3 hold Standing Shoulder Horizontal Abduction with Resistance - 1 x  daily - 7 x weekly - 2 sets - 10 reps - 3 hold      ASSESSMENT:   CLINICAL IMPRESSION: Pt reports no significnat difference in her neck pain after the TPDN completed the last session. PT was completed for manual therapy fo the neck for STM,  UPAs, and suboccipital release. Following manual therapy, therex for cervical flexibility and ROM was completed. Reassessment of cervical ROM found L rotation, a very limited RO was much improved. Pt subjective report indicates neck/upper shoulder pain levels have not significantly changed. Pt tolerated PT today without adverse effects. Anticipate slow progress due to the chronicity of the pt's pain and poor posture c forward head and thoracic kyphosis.   IMPAIRMENTS: Decreased activity tolerance, decreased ROM, decreased strength, increased muscle spasms, postural dysfunction, and pain   REHAB POTENTIAL: Good   CLINICAL DECISION MAKING: Evolving/moderate complexity   EVALUATION COMPLEXITY: Moderate     GOALS:   SHORT TERM GOALS = LTGS   LONG TERM GOALS:    LTG Name Target Date Goal status  1 Pt will be Ind in a final HEP to maintain achieved LOF Baseline: 09/03/21 INITIAL  2 Pt will voice understanding of measures to assist with the reduction and management of pain Baseline: 09/03/21 INITIAL  3 Increase L rotation to 55d and R lat flexion to 35d for improved cervical function. 07/29/21= L rot-75d; R SB 32d Baseline: 09/03/21 Ongoing  4 Improve FOTO to predicted value of 62% Baseline: 57% 09/03/21 INITIAL  5 Pt will report a decrease in her neck/upper shoulder pain to a range of 0-5/10 and /or decreased frequency Baseline:0-8/10 09/03/21 INITIAL    PLAN: PT FREQUENCY: 1x/week   PT DURATION: 10 weeks   PLANNED INTERVENTIONS: Therapeutic exercises, Therapeutic activity, Neuro Muscular re-education, Patient/Family education, Joint mobilization, Dry Needling, Electrical stimulation, Spinal mobilization, Cryotherapy, Moist heat, Taping, Traction,  Ionotophoresis /ml Dexamethasone, and Manual therapy   PLAN FOR NEXT SESSION:  Assess response to HEP, progress therex as indicated, use modalities, TPDN, and manual techniques as indicated. Assess LTGs    Joellyn Rued MS, PT 07/30/21 4:44 PM

## 2021-07-29 ENCOUNTER — Other Ambulatory Visit: Payer: Self-pay

## 2021-07-29 ENCOUNTER — Ambulatory Visit: Payer: Medicare HMO | Attending: Diagnostic Neuroimaging

## 2021-07-29 DIAGNOSIS — R252 Cramp and spasm: Secondary | ICD-10-CM | POA: Insufficient documentation

## 2021-07-29 DIAGNOSIS — M542 Cervicalgia: Secondary | ICD-10-CM | POA: Diagnosis not present

## 2021-07-29 DIAGNOSIS — R293 Abnormal posture: Secondary | ICD-10-CM | POA: Diagnosis not present

## 2021-08-12 ENCOUNTER — Other Ambulatory Visit: Payer: Self-pay

## 2021-08-12 ENCOUNTER — Ambulatory Visit: Payer: Medicare HMO

## 2021-08-12 DIAGNOSIS — R293 Abnormal posture: Secondary | ICD-10-CM | POA: Diagnosis not present

## 2021-08-12 DIAGNOSIS — R252 Cramp and spasm: Secondary | ICD-10-CM

## 2021-08-12 DIAGNOSIS — M542 Cervicalgia: Secondary | ICD-10-CM

## 2021-08-12 NOTE — Therapy (Signed)
?OUTPATIENT PHYSICAL THERAPY TREATMENT NOTE ? ? ?Patient Name: Kelli Pratt ?MRN: [id number] ?DOB:December 24, 1950, 71 y.o., female ?Today's Date: 08/12/2021 ? ?PCP: Adrian Prince, MD ?REFERRING PROVIDER: Suanne Marker, MD ? ? PT End of Session - 08/12/21 1735   ? ? Visit Number 4   ? Number of Visits 5   ? Date for PT Re-Evaluation 09/03/21   ? PT Start Time 1332   ? PT Stop Time 1419   ? PT Time Calculation (min) 47 min   ? Activity Tolerance Patient tolerated treatment well   ? Behavior During Therapy [facility] for tasks assessed/performed   ? ?  ?  ? ?  ? ? ? ? ?Past Medical History:  ?Diagnosis Date  ? Depression   ? MS (multiple sclerosis) (HCC)   ? OA (osteoarthritis)   ? Syncope and collapse   ? ?Past Surgical History:  ?Procedure Laterality Date  ? BREAST BIOPSY Bilateral   ? BREAST EXCISIONAL BIOPSY Left   ? BREAST LUMPECTOMY Left   ? 5 lumps removed, benign, fiborcystic areas removed   ? BUNIONECTOMY Left 2019  ? cataracts    ? REPLACEMENT TOTAL KNEE Bilateral   ? TOTAL ABDOMINAL HYSTERECTOMY    ? ?Patient Active Problem List  ? Diagnosis Date Noted  ? Chest pain 06/27/2018  ? SOB (shortness of breath) 06/27/2018  ? Onychomycosis 06/19/2018  ? Hallux limitus of left foot 05/13/2018  ? ? ?REFERRING DIAG: Neck pain; Left cervical radiculopathy ? ?THERAPY DIAG:  ?Cervicalgia ? ?Cramp and spasm ? ?Abnormal posture ? ?SUBJECTIVE:                                                                                                                                                                                                        ?  ?SUBJECTIVE STATEMENT: ?Pt reports her neck pain level is the same, but her neck ROM is much improved. ?  ?PAIN:  ?Are you having pain? Yes ?NPRS scale: 1/10; 0-4/10 ?Pain location: L lower neck ?Pain orientation: Left  ?PAIN TYPE: burning ?Pain description: intermittent  ?Aggravating factors: Standing and looking down ie cooking, walking at a slow pace, elliptical ?Relieving factors:  Gabapentin ? ?PERTINENT HISTORY:  ?OA, MS, depression ?  ?PRECAUTIONS: None ?  ?WEIGHT BEARING RESTRICTIONS No ?  ?FALLS:  ?Has patient fallen in last 6 months? No ?Number of falls: 0 ?  ?LIVING ENVIRONMENT: ?Lives with: lives with their family ?No issue with accessing her home or mobility within it ?  ?OCCUPATION: Retired ?  ?PLOF: Independent ?  ?PATIENT GOALS: For  the pain to decrease and to know how to better manage the pain ?  ?OBJECTIVE:  ?  *Unless otherwise noted all objective measures were captured on initial evaluation.  ?  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  C4-5 also shows moderate left and mild right-sided foraminal and canal narrowing. ?  ?PATIENT SURVEYS:  ?FOTO 57%  perceived functional status; Predicted post PT 62% ?  ?  ?COGNITION: ?Overall cognitive status: Within functional limits for tasks assessed ?           ?SENSATION: ?Light touch: Appears intact ?  ?POSTURE:  ?Moderate FH, rounded shoulders, increased thoracic kyphosis and lumbar lordosis ?  ?PALPATION: ?Marked increase muscle tightness and TPs of the upper trap with TTP.      ?  ?CERVICAL AROM/PROM ?  ?A/PROM A/PROM (deg) ?07/02/2021 A/PROM (deg) ?07/29/2021 AROM  ?Flexion 55 c neck upper shoulder tightness    ?Extension 45 c tightness and pressure    ?Right lateral flexion 28 c L neck and upper shoulder tightness 32 43  ?Left lateral flexion 35 d c L neck and upper shoulder pressure 35 47  ?Right rotation 65 c L neck and  upper shoulder tightness 65   ?Left rotation 40 c L neck and upper pain and pressure 75   ? (Blank rows = not tested) ?  ?UE AROM/PROM: ?UE are grossly WNLs ?  ?UE MMT: ?UE myotomal screen negative ?  ?CERVICAL SPECIAL TESTS:  ?Spurling's test: Positive L ? ?[facility] Adult PT Treatment:                                                DATE:  08/12/21 ?Therapeutic Exercise: ?DNF x10 5" supine ?Standing Cervical Rotation AROM with Overpressure - 3 reps - 5 hold ?Seated Cervical Sidebending AROM - 3 reps - 5 hold ?Seated thoracic ext x10 3" ?Shoulder row 2x10 GTB ?Shoulder ext 2x10 GTB ?Updated HEP ?Manual Therapy: ?STM and DTM to the cervical paraspinals, upper traps, and levator. Suboccipital release ? ?Trigger Point Dry Needling Treatment: ?Skilled palpation for taut muscle bands and trigger points ?Pre-treatment instruction: Patient instructed on dry needling rationale, procedures, and possible side effects including pain during treatment (achy,cramping feeling), bruising, drop of blood, lightheadedness, nausea, sweating. ?Patient Consent Given: Yes ?Education handout provided: Previously provided ?Muscles treated: L and R upper traps  ?Needle size and number: .30x17mm 1 needle ?Electrical stimulation performed: No ?Parameters: N/A ?Treatment response/outcome: Twitch response elicited and Palpable decrease in muscle tension ?Post-treatment instructions: Patient instructed to expect possible mild to moderate muscle soreness later today and/or tomorrow. Patient instructed in methods to reduce muscle soreness and to continue prescribed HEP. If patient was dry needled over the lung field, patient was instructed on signs and symptoms of pneumothorax and, however unlikely, to see immediate medical attention should they occur. Patient was also educated on signs and symptoms of infection and to seek medical attention should they occur. Patient verbalized understanding of these instructions and education.  ? ?[facility] Adult PT Treatment:  DATE: 07/29/21 ?Therapeutic Exercise: ?Seated Scapular Retraction reps - 10 reps - 3 hold ?Seated Passive Cervical Retraction - 10 reps - 3 hold ?Standing Cervical Rotation AROM with Overpressure - 3 reps - 5 hold ?Seated Cervical Sidebending AROM - 3 reps - 5 hold ?Seated thoracic ext  x10 3" ?Pectoral stretch in doorway 90 and 120d x2 each 3" ?Updated HEP ?Manual Therapy: ?STM to the cervical paraspinals, upper traps, and levator c TPMR of the traps. Suboccipital release. ?Grade ll UPAs to C2-C6- no increase in pain  ? ?[facility] Adult PT Treatment:                                                DATE: 07/15/21 ?Therapeutic Exercise: ?Seated Scapular Retraction reps - 10 reps - 3 hold ?Seated Passive Cervical Retraction - 10 reps - 3 hold ?Standing Cervical Rotation AROM with Overpressure - 3 reps - 5 hold ?Seated Cervical Sidebending AROM - 3 reps - 5 hold ?Seated thoracic ext x10 3" ?Pectoral stretch in doorway 90 and 120d x2 each 3" ?Shoulder hor abd 2x10 RTB ?Shoulder ER 2x10 RTB ?Updated HEP ?Manual Therapy: ?STM to the upper traps and levator ?Skilled palpation for the ID of taut muscles and TPs  ? ?Trigger Point Dry Needling Treatment: ?Pre-treatment instruction: Patient instructed on dry needling rationale, procedures, and possible side effects including pain during treatment (achy,cramping feeling), bruising, drop of blood, lightheadedness, nausea, sweating. ?Patient Consent Given: Yes ?Education handout provided: Yes ?Muscles treated: Upper trap and levator, L and R  ?Needle size and number: .30x42mm x 1 ?Electrical stimulation performed: No ?Parameters: N/A ?Treatment response/outcome: Twitch response elicited and Palpable decrease in muscle tension ?Post-treatment instructions: Patient instructed to expect possible mild to moderate muscle soreness later today and/or tomorrow. Patient instructed in methods to reduce muscle soreness and to continue prescribed HEP. If patient was dry needled over the lung field, patient was instructed on signs and symptoms of pneumothorax and, however unlikely, to see immediate medical attention should they occur. Patient was also educated on signs and symptoms of infection and to seek medical attention should they occur. Patient verbalized understanding of  these instructions and education.  ? ?Trigger Point Dry Needling ? ?What is Trigger Point Dry Needling (DN)? ?DN is a physical therapy technique used to treat muscle pain and dysfunction. Specifically, DN helps de

## 2021-08-23 NOTE — Therapy (Signed)
?OUTPATIENT PHYSICAL THERAPY TREATMENT NOTE ? ? ?Patient Name: Kelli Pratt ?MRN: [id number] ?DOB:Dec 09, 1950, 71 y.o., female ?Today's Date: 08/24/2021 ? ?PCP: Reynold Bowen, MD ?REFERRING PROVIDER: Reynold Bowen, MD ? ? PT End of Session - 08/24/21 2126   ? ? Visit Number 5   ? Number of Visits 5   ? PT Start Time 1503   ? PT Stop Time [id number]   ? PT Time Calculation (min) 44 min   ? Activity Tolerance Patient tolerated treatment well   ? Behavior During Therapy [facility] for tasks assessed/performed   ? ?  ?  ? ?  ? ? ? ? ? ?Past Medical History:  ?Diagnosis Date  ? Depression   ? MS (multiple sclerosis) ([place])   ? OA (osteoarthritis)   ? Syncope and collapse   ? ?Past Surgical History:  ?Procedure Laterality Date  ? BREAST BIOPSY Bilateral   ? BREAST EXCISIONAL BIOPSY Left   ? BREAST LUMPECTOMY Left   ? 5 lumps removed, benign, fiborcystic areas removed   ? BUNIONECTOMY Left 2019  ? cataracts    ? REPLACEMENT TOTAL KNEE Bilateral   ? TOTAL ABDOMINAL HYSTERECTOMY    ? ?Patient Active Problem List  ? Diagnosis Date Noted  ? Chest pain 06/27/2018  ? SOB (shortness of breath) 06/27/2018  ? Onychomycosis 06/19/2018  ? Hallux limitus of left foot 05/13/2018  ? ? ?REFERRING DIAG: Neck pain; Left cervical radiculopathy ? ?THERAPY DIAG:  ?Cervicalgia ? ?Cramp and spasm ? ?Abnormal posture ? ?SUBJECTIVE:                                                                                                                                                                                                        ?  ?SUBJECTIVE STATEMENT: ?My neck pain is a mixed bag, no reason as to why my pain is worse one day and better the next. Today I am not hurting, but yesterday I had pain all day. The TPDN seemed to help for a few days. My ROM is a lot better ?  ?PAIN:  ?Are you having pain? Yes ?NPRS scale: Currently 0/10; Range 0-5/10 ?Pain location: L lower neck ?Pain orientation: Left  ?PAIN TYPE: burning ?Pain description: intermittent   ?Aggravating factors: Standing and looking down ie cooking, walking at a slow pace, elliptical ?Relieving factors: Gabapentin ? ?PERTINENT HISTORY:  ?OA, MS, depression ?  ?PRECAUTIONS: None ?  ?WEIGHT BEARING RESTRICTIONS No ?  ?FALLS:  ?Has patient fallen in last 6 months? No ?Number of falls: 0 ?  ?LIVING ENVIRONMENT: ?Lives with:  lives with their family ?No issue with accessing her home or mobility within it ?  ?OCCUPATION: Retired ?  ?PLOF: Independent ?  ?PATIENT GOALS: For the pain to decrease and to know how to better manage the pain ?  ?OBJECTIVE:  ?  *Unless otherwise noted all objective measures were captured on initial evaluation.  ?  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  C4-5 also shows moderate left and mild right-sided foraminal and canal narrowing. ?  ?PATIENT SURVEYS:  ?FOTO 57%  perceived functional status; Predicted post PT 62% ?  ?  ?COGNITION: ?Overall cognitive status: Within functional limits for tasks assessed ?           ?SENSATION: ?Light touch: Appears intact ?  ?POSTURE:  ?Moderate FH, rounded shoulders, increased thoracic kyphosis and lumbar lordosis ?  ?PALPATION: ?Marked increase muscle tightness and TPs of the upper trap with TTP.      ?  ?CERVICAL AROM/PROM ?  ?A/PROM A/PROM (deg) ?07/02/2021 A/PROM (deg) ?07/29/2021 AROM ?08/12/21 AROM ?08/24/21  ?Flexion 55 c neck upper shoulder tightness     ?Extension 45 c tightness and pressure     ?Right lateral flexion 28 c L neck and upper shoulder tightness 32 43   ?Left lateral flexion 35 d c L neck and upper shoulder pressure 35 47   ?Right rotation 65 c L neck and  upper shoulder tightness 65  70  ?Left rotation 40 c L neck and upper pain and pressure 75  76  ? (Blank rows = not tested) ?  ?UE AROM/PROM: ?UE are grossly WNLs ?  ?UE MMT: ?UE myotomal screen  negative ?  ?CERVICAL SPECIAL TESTS:  ?Spurling's test: Positive L ? ?[facility] Adult PT Treatment:                                                DATE: 08/24/21 ?Therapeutic Exercise: ?DNF x10 5" supine ?Standing Cervical Rotation AROM with Overpressure - 3 reps - 5 hold ?Seated thoracic ext x10 3" ?Open book 5x 5" ?Updated HEP ?Manual Therapy: ?STM to the cervical paraspinals, suboccipitals, and upper traps.  ?Suboccipital release ? ?[facility] Adult PT Treatment:                                                DATE: 08/12/21 ?Therapeutic Exercise: ?DNF x10 5" supine ?Standing Cervical Rotation AROM with Overpressure - 3 reps - 5 hold ?Seated Cervical Sidebending AROM - 3 reps - 5 hold ?Seated thoracic ext x10 3" ?Shoulder row 2x10 GTB ?Shoulder ext 2x10 GTB ?Updated HEP ?Manual Therapy: ?STM and DTM to the cervical paraspinals, upper traps, and levator. Suboccipital release ? ?Trigger Point Dry Needling Treatment: ?Skilled palpation for taut muscle bands and trigger points ?Pre-treatment instruction: Patient instructed on dry needling rationale, procedures, and possible side effects including pain during treatment (achy,cramping feeling), bruising, drop of blood, lightheadedness, nausea, sweating. ?Patient Consent Given: Yes ?Education handout provided: Previously provided ?Muscles treated: L and R upper traps  ?Needle size and number: .30x35mm 1 needle ?Electrical stimulation performed: No ?Parameters: N/A ?Treatment response/outcome: Twitch response elicited  and Palpable decrease in muscle tension ?Post-treatment instructions: Patient instructed to expect possible mild to moderate muscle soreness later today and/or tomorrow. Patient instructed in methods to reduce muscle soreness and to continue prescribed HEP. If patient was dry needled over the lung field, patient was instructed on signs and symptoms of pneumothorax and, however unlikely, to see immediate medical attention should they occur. Patient was also educated on  signs and symptoms of infection and to seek medical attention should they occur. Patient verbalized understanding of these instructions and education.  ? ?[facility] Adult PT Treatment:                                                DATE: 07/29/21 ?Therapeutic Exercise: ?Seated Scapular Retraction reps - 10 reps - 3 hold ?Seated Passive Cervical Retraction - 10 reps - 3 hold ?Standing Cervical Rotation AROM with Overpressure - 3 reps - 5 hold ?Seated Cervical Sidebending AROM - 3 reps - 5 hold ?Seated thoracic ext x10 3" ?Pectoral stretch in doorway 90 and 120d x2 each 3" ?Updated HEP ?Manual Therapy: ?STM to the cervical paraspinals, upper traps, and levator c TPMR of the traps. Suboccipital release. ?Grade ll UPAs to C2-C6- no increase in pain ?  ?  ?PATIENT EDUCATION:  ?Education details: Eval findings, POC, HEP, sleeping positions and support to assist with pain, use of tennis ball and theracane for mid back and upper shoulder massage ?Person educated: Patient ?Education method: Explanation, Demonstration, Tactile cues, Verbal cues, and Handouts ?Education comprehension: verbalized understanding, returned demonstration, verbal cues required, and tactile cues required ?  ?  ?HOME EXERCISE PROGRAM: ? ?Access Code: [email or web address] ?URL: [email or web address] ?Date: 08/12/2021 ?Prepared by: Gar Ponto ? ?Exercises ?- Seated Scapular Retraction  - 6 x daily - 7 x weekly - 1 sets - 3-10 reps - 3 hold ?- Seated Passive Cervical Retraction  - 6 x daily - 7 x weekly - 1 sets - 3-10 reps - 3 hold ?- Standing Cervical Rotation AROM with Overpressure  - 6 x daily - 7 x weekly - 1 sets - 3-5 reps - 5 hold ?- Seated Cervical Sidebending AROM  - 6 x daily - 7 x weekly - 1 sets - 3-5 reps - 5 hold ?- Seated Thoracic Extension with Hands Behind Neck  - 1 x daily - 7 x weekly - 1 sets - 10 reps - 3 hold ?- Doorway Pec Stretch at 90 Degrees Abduction  - 1 x daily - 7 x weekly - 1 sets - 3 reps - 30 hold ?- Shoulder External  Rotation and Scapular Retraction with Resistance  - 1 x daily - 7 x weekly - 2 sets - 10 reps - 3 hold ?- Standing Shoulder Horizontal Abduction with Resistance  - 1 x daily - 7 x weekly - 2 sets - 10 reps - 3 hold ?- Supin

## 2021-08-24 ENCOUNTER — Ambulatory Visit: Payer: Medicare HMO | Attending: Diagnostic Neuroimaging

## 2021-08-24 DIAGNOSIS — R293 Abnormal posture: Secondary | ICD-10-CM | POA: Diagnosis not present

## 2021-08-24 DIAGNOSIS — R252 Cramp and spasm: Secondary | ICD-10-CM | POA: Insufficient documentation

## 2021-08-24 DIAGNOSIS — M542 Cervicalgia: Secondary | ICD-10-CM | POA: Diagnosis not present

## 2021-09-13 IMAGING — US US EXTREM LOW*L* LIMITED
1 series · 14 of 22 positions shown · non-contrast
Comparison: RADIOGRAPHS DATED 12/06/2018

CLINICAL DATA: Soft tissue mass on the dorsum of the left foot.
Diffuse foot pain.

EXAM:
ULTRASOUND LEFT  LOWER EXTREMITY LIMITED
TECHNIQUE: Ultrasound examination of the lower extremity soft tissues was
performed in the area of clinical concern.

[Series 1: us extrem low*left* limited · 0.03mm/px · 14 of 22 slices shown]
[im 1/22]
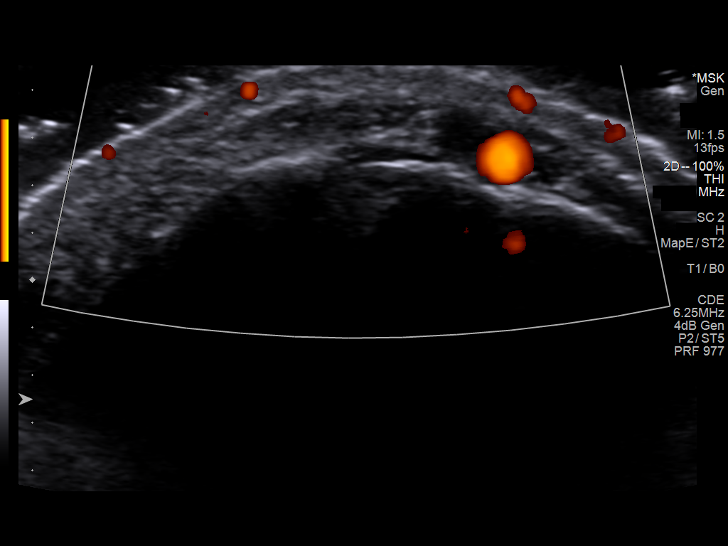
[im 3/22]
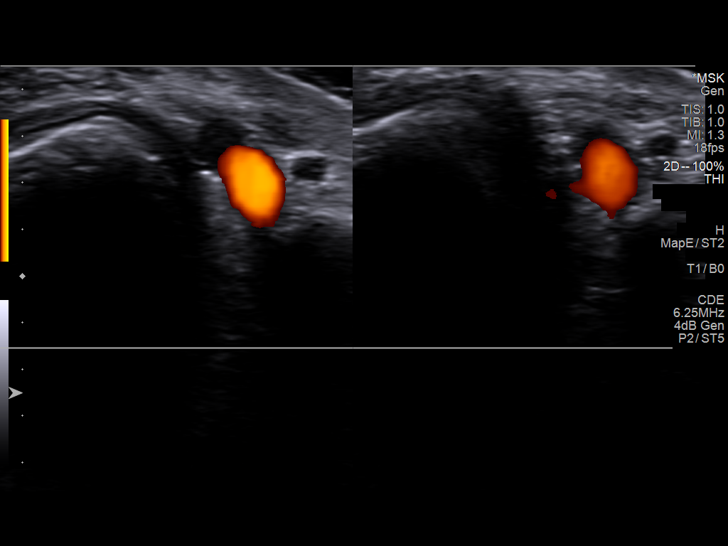
[im 4/22]
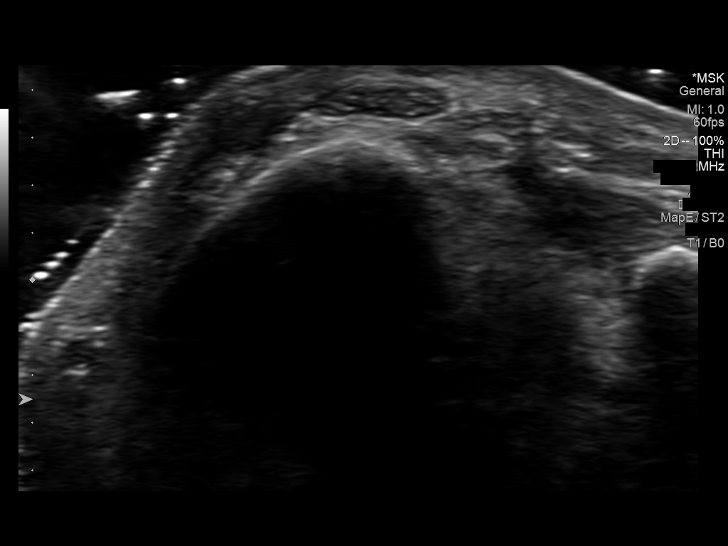
[im 6/22]
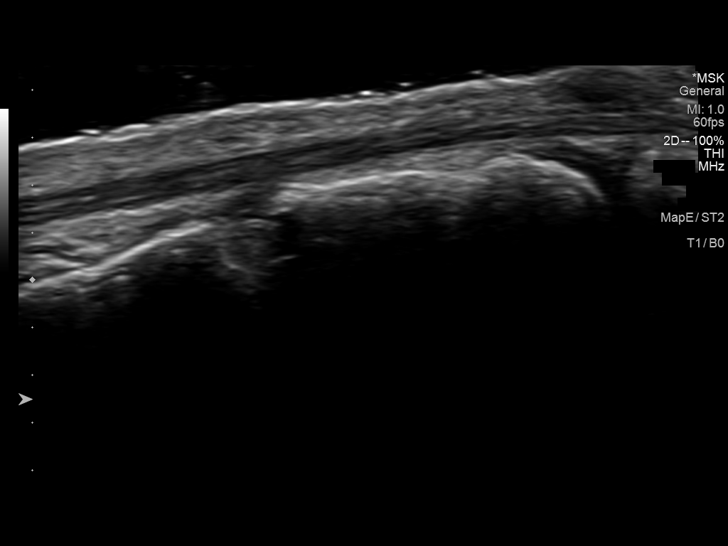
[im 8/22]
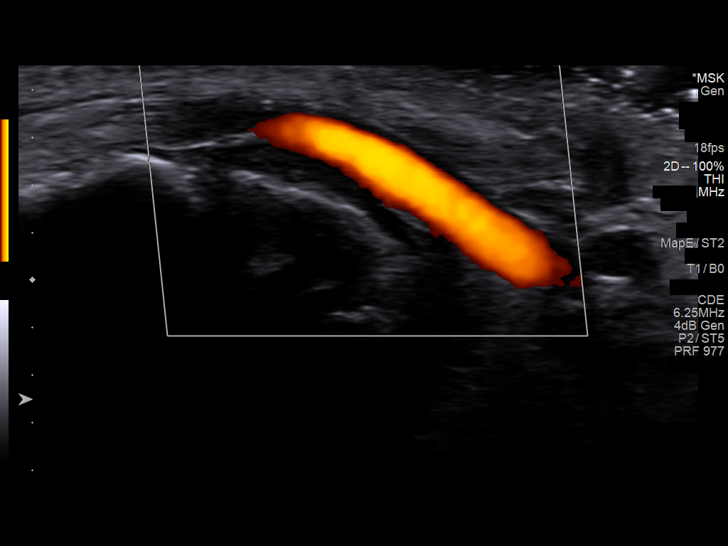
[im 9/22]
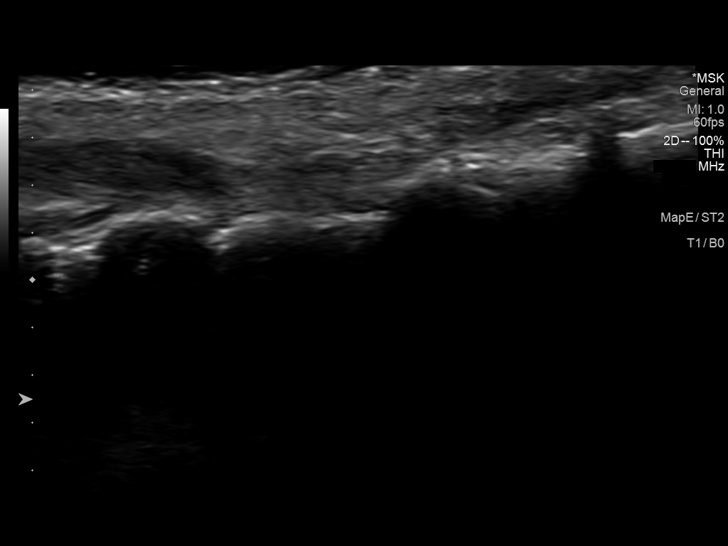
[im 11/22]
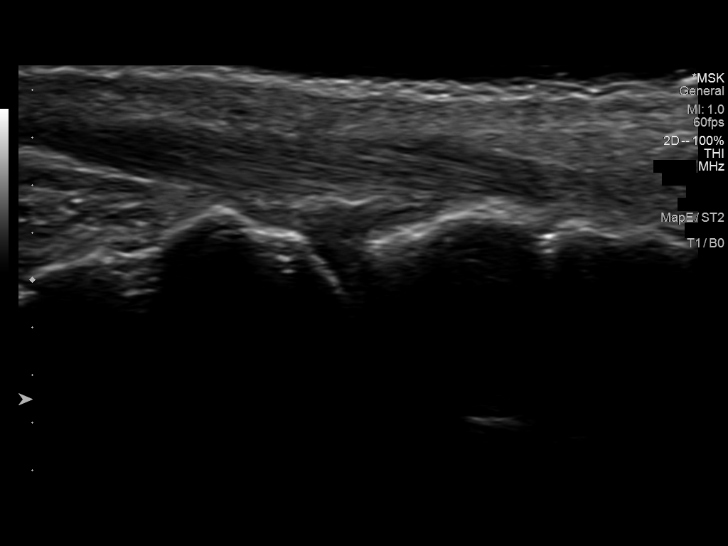
[im 12/22]
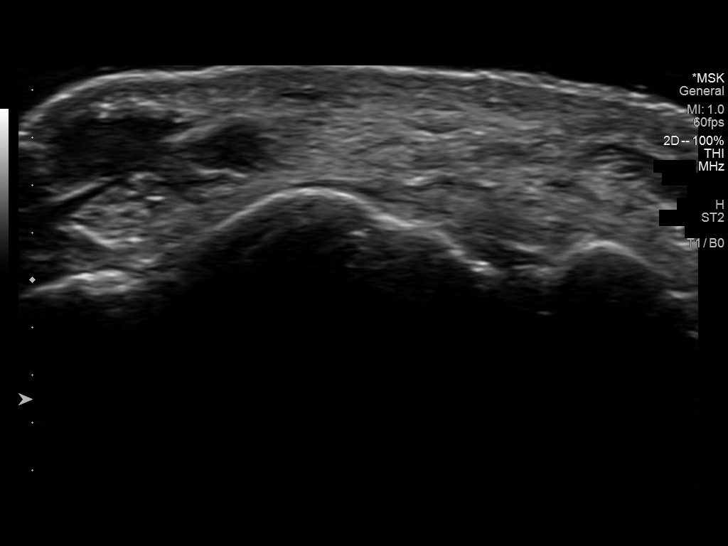
[im 14/22]
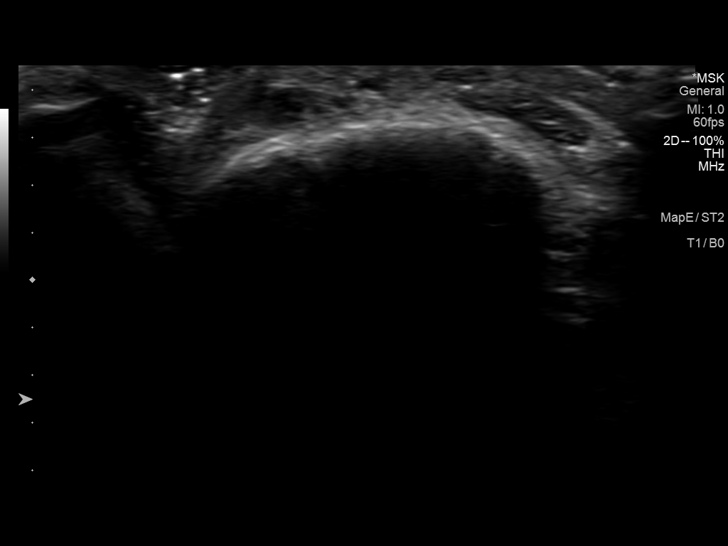
[im 15/22]
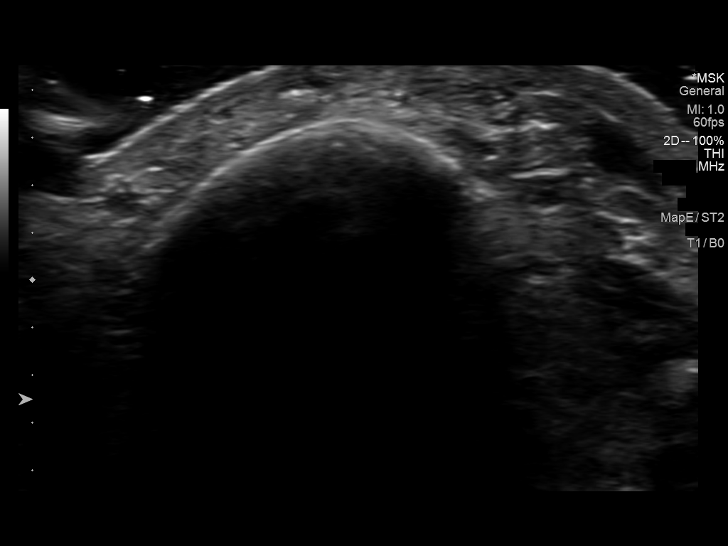
[im 17/22]
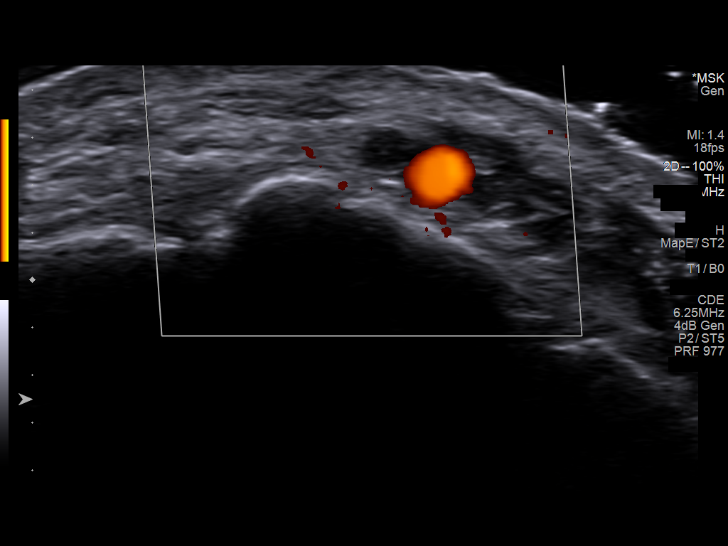
[im 19/22]
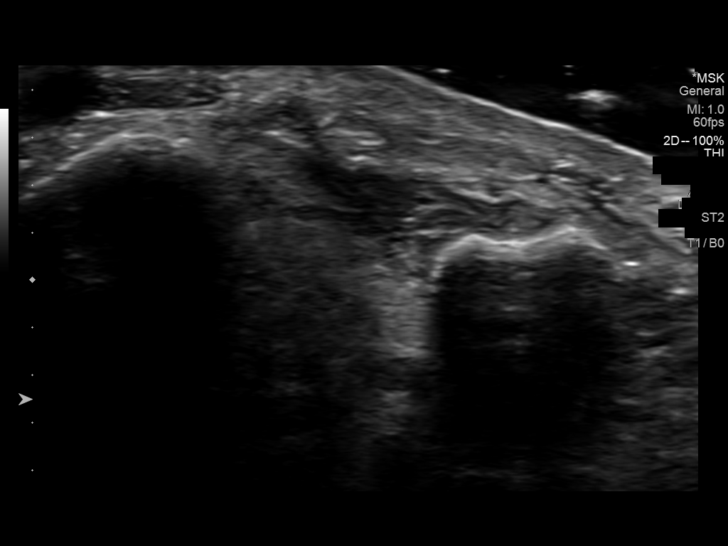
[im 20/22]
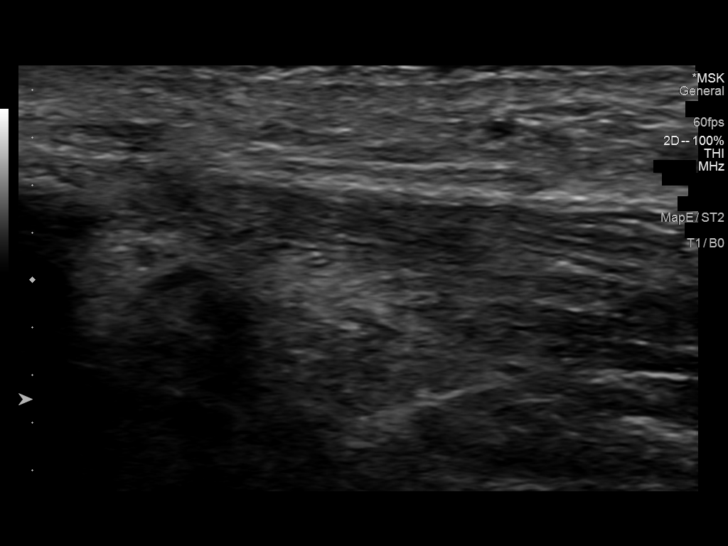
[im 22/22]
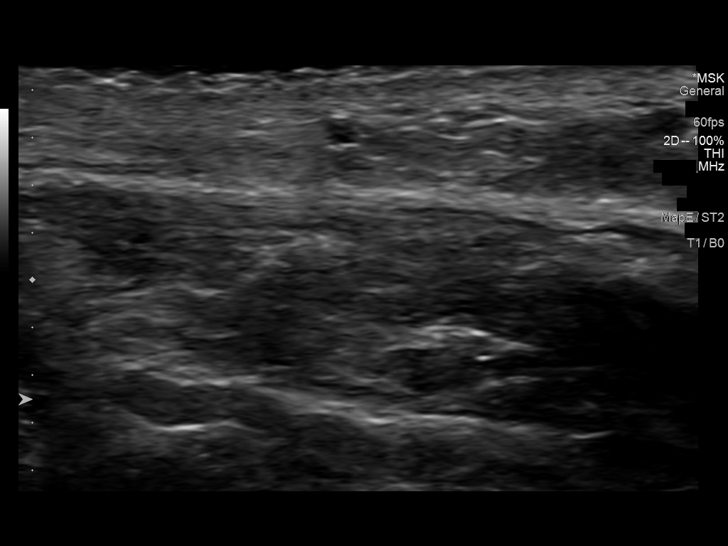

[14 of 22 positions shown; findings below may reference images not displayed]

FINDINGS: Joint Space: No effusion.

Muscles: Normal.

Tendons: Normal

Other Soft Tissue Structures: Normal.
IMPRESSION: No appreciable soft tissue mass or cyst in the area of concern.
Normal appearing tendons and muscles and vessels.

## 2021-09-21 DIAGNOSIS — L218 Other seborrheic dermatitis: Secondary | ICD-10-CM | POA: Diagnosis not present

## 2021-09-21 DIAGNOSIS — L821 Other seborrheic keratosis: Secondary | ICD-10-CM | POA: Diagnosis not present

## 2021-12-13 DIAGNOSIS — E559 Vitamin D deficiency, unspecified: Secondary | ICD-10-CM | POA: Diagnosis not present

## 2021-12-13 DIAGNOSIS — E785 Hyperlipidemia, unspecified: Secondary | ICD-10-CM | POA: Diagnosis not present

## 2021-12-13 DIAGNOSIS — R7989 Other specified abnormal findings of blood chemistry: Secondary | ICD-10-CM | POA: Diagnosis not present

## 2021-12-13 DIAGNOSIS — Z0000 Encounter for general adult medical examination without abnormal findings: Secondary | ICD-10-CM | POA: Diagnosis not present

## 2021-12-16 DIAGNOSIS — Z1389 Encounter for screening for other disorder: Secondary | ICD-10-CM | POA: Diagnosis not present

## 2021-12-16 DIAGNOSIS — M545 Low back pain, unspecified: Secondary | ICD-10-CM | POA: Diagnosis not present

## 2021-12-16 DIAGNOSIS — Z1331 Encounter for screening for depression: Secondary | ICD-10-CM | POA: Diagnosis not present

## 2021-12-16 DIAGNOSIS — Z0000 Encounter for general adult medical examination without abnormal findings: Secondary | ICD-10-CM | POA: Diagnosis not present

## 2021-12-16 DIAGNOSIS — I499 Cardiac arrhythmia, unspecified: Secondary | ICD-10-CM | POA: Diagnosis not present

## 2021-12-16 DIAGNOSIS — R82998 Other abnormal findings in urine: Secondary | ICD-10-CM | POA: Diagnosis not present

## 2021-12-16 DIAGNOSIS — E559 Vitamin D deficiency, unspecified: Secondary | ICD-10-CM | POA: Diagnosis not present

## 2021-12-16 DIAGNOSIS — E785 Hyperlipidemia, unspecified: Secondary | ICD-10-CM | POA: Diagnosis not present

## 2021-12-16 DIAGNOSIS — Z23 Encounter for immunization: Secondary | ICD-10-CM | POA: Diagnosis not present

## 2021-12-16 DIAGNOSIS — G35 Multiple sclerosis: Secondary | ICD-10-CM | POA: Diagnosis not present

## 2021-12-16 DIAGNOSIS — M542 Cervicalgia: Secondary | ICD-10-CM | POA: Diagnosis not present

## 2021-12-27 IMAGING — MG DIGITAL SCREENING BILAT W/ TOMO W/ CAD
4 series · 4 of 8 positions shown · non-contrast
Comparison: Previous exam(s).

CLINICAL DATA: Screening.

EXAM:
DIGITAL SCREENING BILATERAL MAMMOGRAM WITH TOMO AND CAD

[L CC synth-2D]
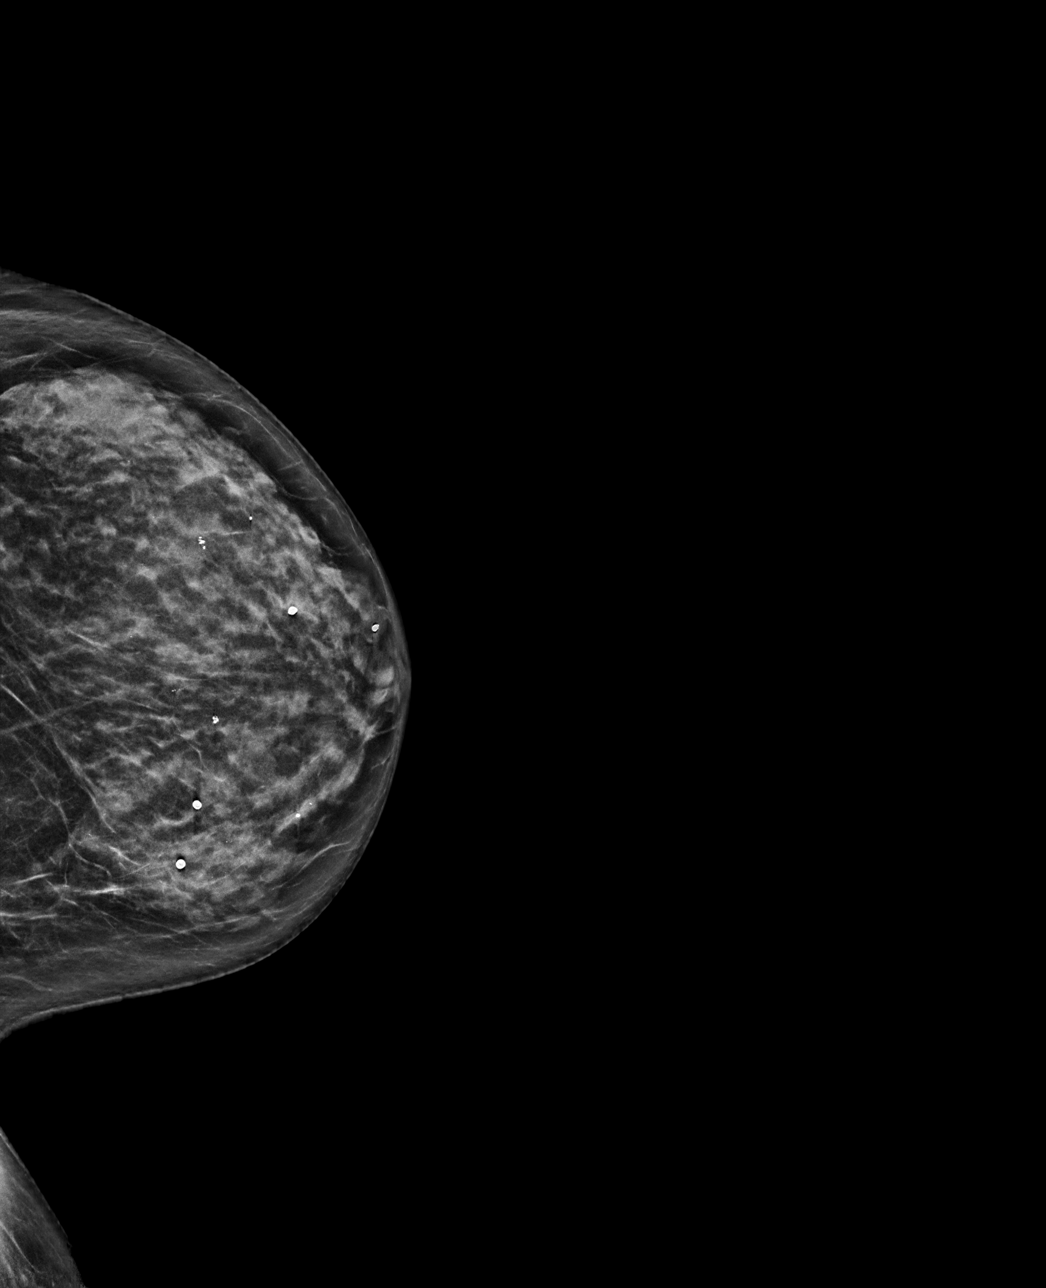

[L MLO synth-2D]
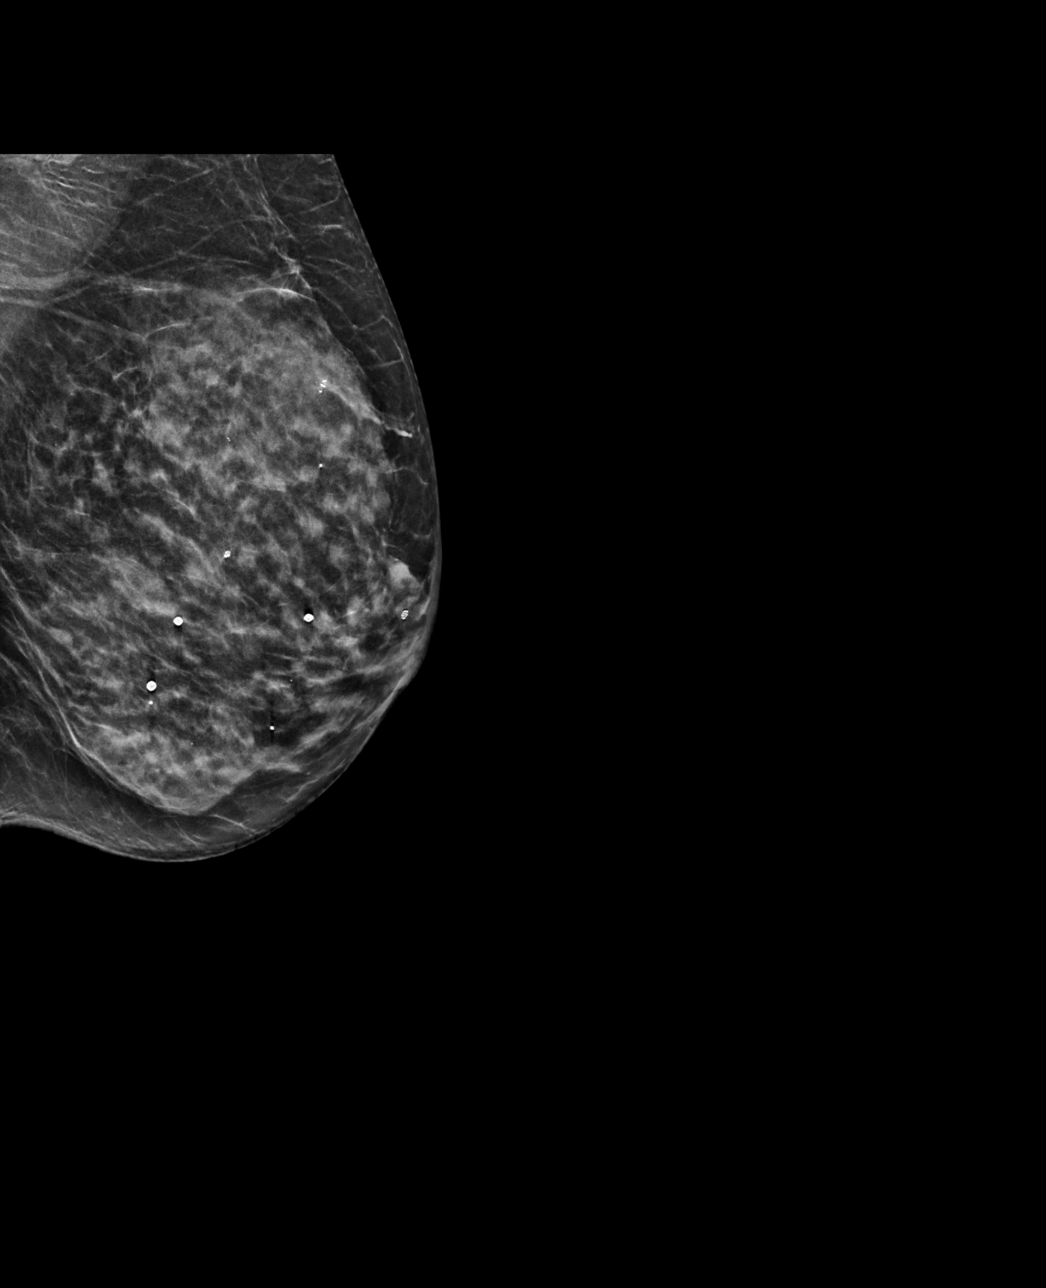

[R MLO synth-2D]
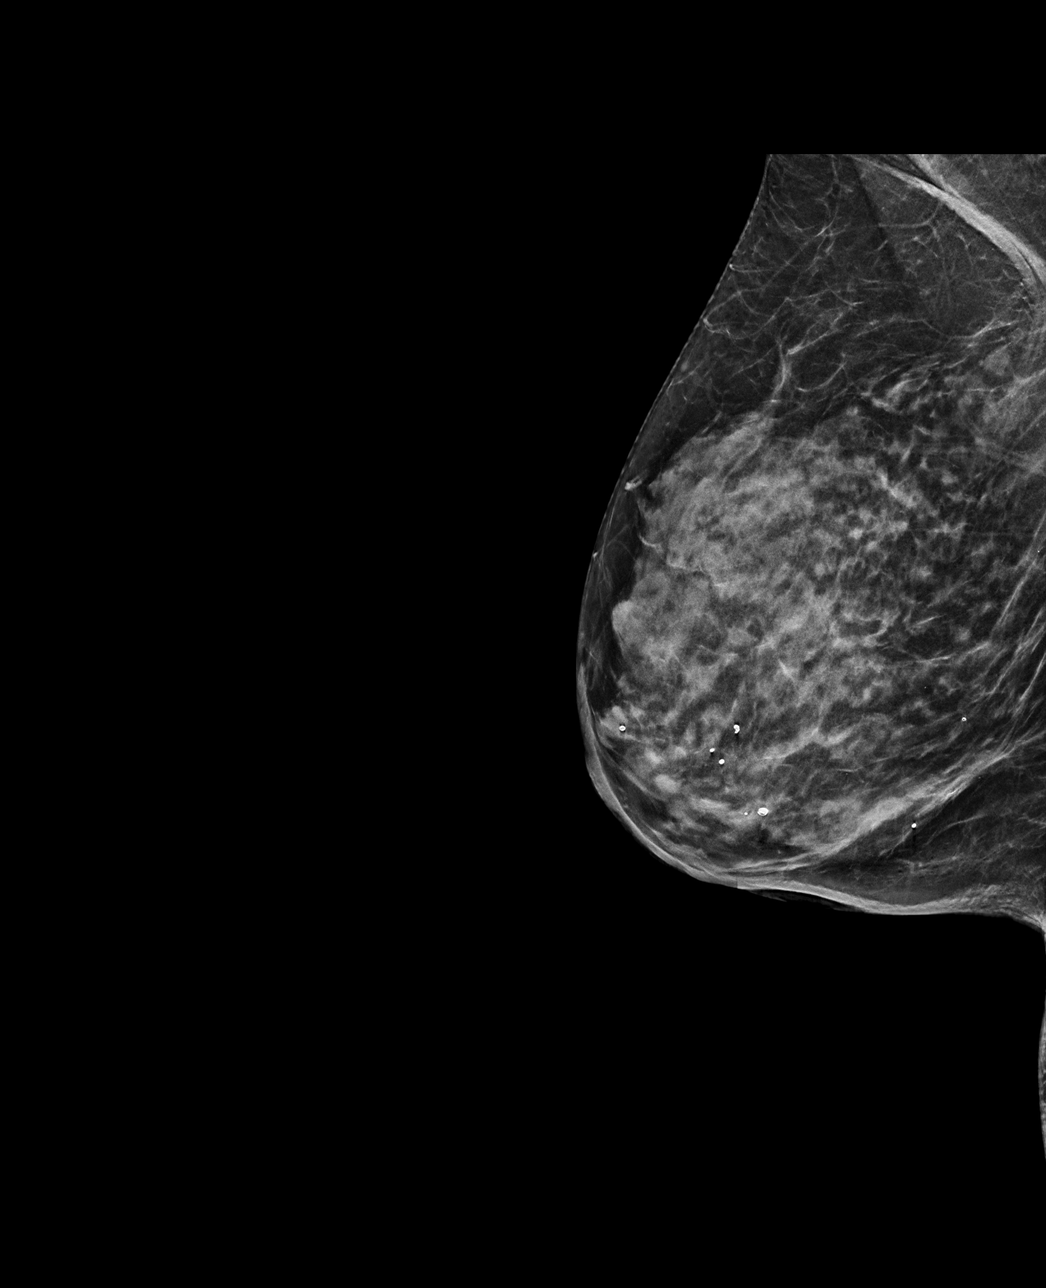

[R CC tomo · tomo slice 27/53.0]
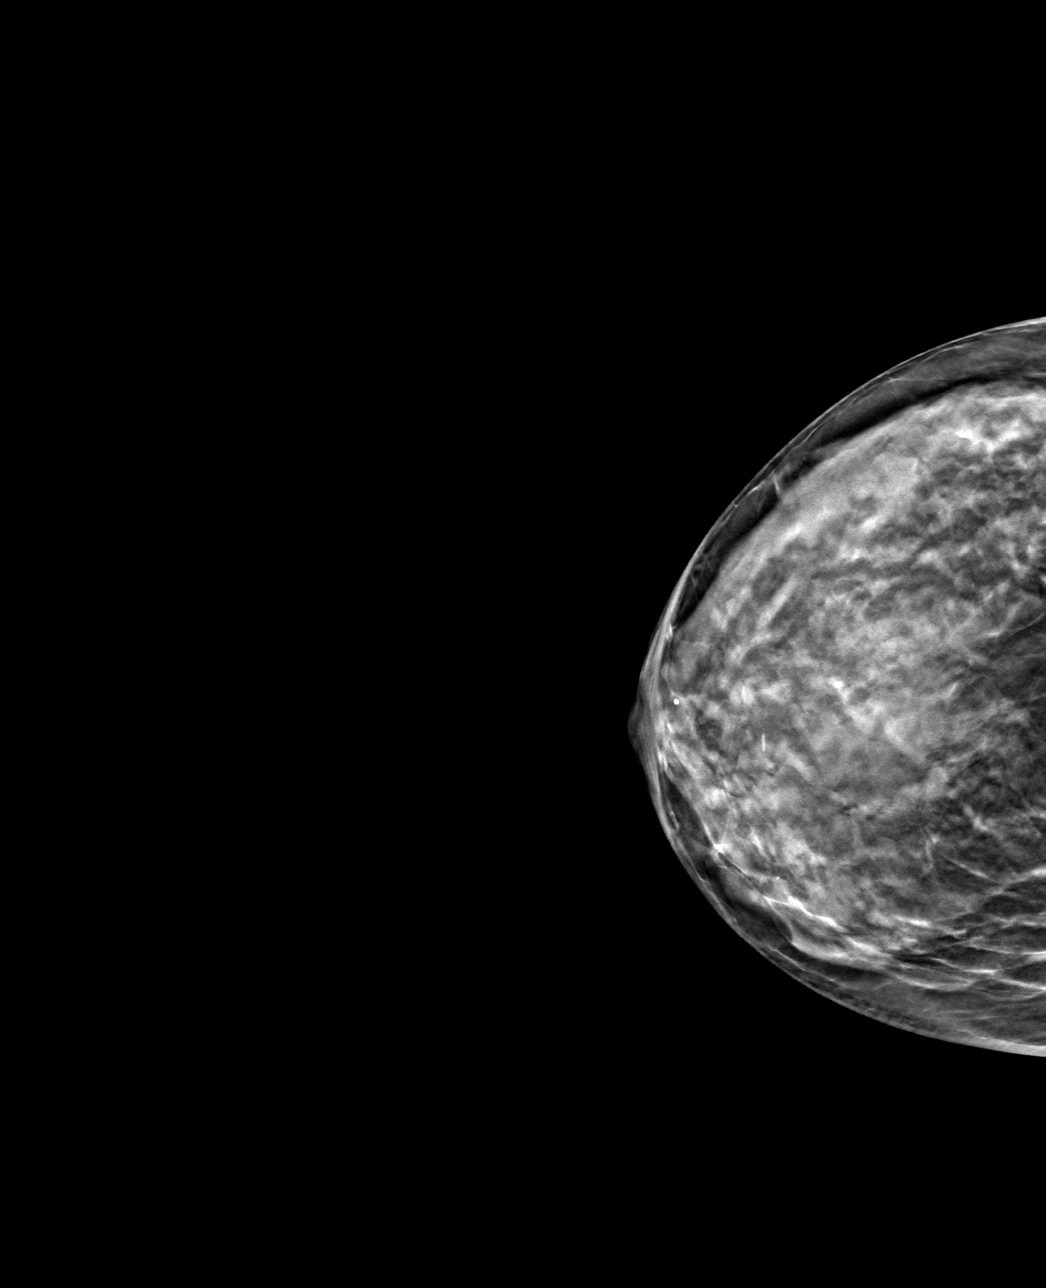

[4 of 8 positions shown; findings below may reference images not displayed]

ACR Breast Density Category d: The breast tissue is extremely dense,
which lowers the sensitivity of mammography
FINDINGS: There are no findings suspicious for malignancy. Images were
processed with CAD.
IMPRESSION: No mammographic evidence of malignancy. A result letter of this
screening mammogram will be mailed directly to the patient.

RECOMMENDATION:
Screening mammogram in one year. (Code:[id number]-[id number])

BI-RADS CATEGORY  1: Negative.

## 2022-05-27 ENCOUNTER — Other Ambulatory Visit: Payer: Self-pay | Admitting: Diagnostic Neuroimaging

## 2022-06-27 ENCOUNTER — Encounter: Payer: Self-pay | Admitting: Diagnostic Neuroimaging

## 2022-06-27 ENCOUNTER — Telehealth: Payer: Self-pay | Admitting: Diagnostic Neuroimaging

## 2022-06-27 ENCOUNTER — Ambulatory Visit: Payer: Medicare HMO | Admitting: Diagnostic Neuroimaging

## 2022-06-27 VITALS — BP 162/94 | HR 75 | Ht 66.0 in | Wt 135.0 lb

## 2022-06-27 DIAGNOSIS — M542 Cervicalgia: Secondary | ICD-10-CM

## 2022-06-27 MED ORDER — PREDNISONE 10 MG PO TABS: ORAL_TABLET | ORAL | 0 refills | Status: AC

## 2022-06-27 NOTE — Telephone Encounter (Signed)
Referral for Neurosurgery fax to [facility]. Phone: [phone], Fax: [phone] 

## 2022-06-27 NOTE — Progress Notes (Signed)
[facility]  PATIENT: Kelli Pratt DOB: [birth year]  REFERRING CLINICIAN: Reynold Bowen, MD HISTORY FROM: patient  REASON FOR VISIT: follow up    HISTORICAL  CHIEF COMPLAINT:  Chief Complaint  Patient presents with   Neck Pain    RM 6 alone Pt is well, reports she is having a increase in neck pain recently.     HISTORY OF PRESENT ILLNESS:   UPDATE (06/27/22, VRP): Since last visit, doing about the same. Symptoms are in the neck; left more than right. Tried PT and dry needling; gabapentin. Not much relief.   PRIOR HPI: 72 year old female here for evaluation of neck pain.  Patient has history of optic neuritis in 1985, diagnosed with multiple sclerosis.  She was treated with Betaseron and Copaxone.  She was last treated with disease modifying therapy in 2008.  Then patient stopped therapy because of lack of benefit, side effects and cost.  She had an episode of left optic neuritis in 2017 treated with steroids.  She has not wanted to pursue additional MRI or MS therapy since that time.  Patient also has chronic left neck pain radiating to her left shoulder.  This has been intermittent flared up sporadically over many years.  This has significantly worsened in the last 3 to 6 months.   REVIEW OF SYSTEMS: Full 14 system review of systems performed and negative with exception of: as per HPI.  ALLERGIES: Allergies  Allergen Reactions   Versed [Midazolam]     HOME MEDICATIONS: Outpatient Medications Prior to Visit  Medication Sig Dispense Refill   aspirin EC 81 MG tablet Take 1 tablet (81 mg total) by mouth daily.     clonazePAM (KLONOPIN) 0.5 MG tablet Take 1 tablet by mouth 3 (three) times daily.     desvenlafaxine (PRISTIQ) 50 MG 24 hr tablet TAKE ONE TABLE BY MOUTH DAILY  5   estradiol (ESTRACE) 0.5 MG tablet Take 0.5 mg by mouth daily.  2   meloxicam (MOBIC) 15 MG tablet TAKE 1 TABLET BY MOUTH EVERY DAY 30 tablet 0   promethazine (PHENERGAN) 25  MG tablet Take 1 tablet (25 mg total) by mouth every 8 (eight) hours as needed for nausea or vomiting. 20 tablet 0   rosuvastatin (CRESTOR) 5 MG tablet TAKE 1 TABLET BY MOUTH EVERY OTHER DAY 45 tablet 0   Vitamin D, Ergocalciferol, (DRISDOL) 50000 units CAPS capsule TAKE ONE (1) CAPSULE BY MOUTH ONCE (1X) A WEEK  2   gabapentin (NEURONTIN) 100 MG capsule TAKE 1 CAPSULE (100 MG TOTAL) BY MOUTH THREE TIMES DAILY. 90 capsule 0   oxyCODONE-acetaminophen (PERCOCET/ROXICET) 5-325 MG tablet Take 1-2 tablets by mouth every 6 (six) hours as needed for severe pain. (Patient not taking: Reported on 06/27/2022) 20 tablet 0   MYRBETRIQ 50 MG TB24 tablet Take 50 mg by mouth at bedtime. (Patient not taking: Reported on 06/14/2021)  2   NONFORMULARY OR COMPOUNDED ITEM [facility]:  Antifungal Cream - Terbinafine 3%, Fluconazole 2%, Tea Tree Oil 5%, Urea 10%, Ibuprofen 2% in DMSO #70ml suspension. Apply to affected toenail(s) once (at bedtime) or twice daily. 30 each 5   No facility-administered medications prior to visit.    PAST MEDICAL HISTORY: Past Medical History:  Diagnosis Date   Depression    MS (multiple sclerosis) ([place])    OA (osteoarthritis)    Syncope and collapse     PAST SURGICAL HISTORY: Past Surgical History:  Procedure Laterality Date   BREAST BIOPSY Bilateral  BREAST EXCISIONAL BIOPSY Left    BREAST LUMPECTOMY Left    5 lumps removed, benign, fiborcystic areas removed    BUNIONECTOMY Left 2019   cataracts     REPLACEMENT TOTAL KNEE Bilateral    TOTAL ABDOMINAL HYSTERECTOMY      FAMILY HISTORY: Family History  Problem Relation Age of Onset   Hyperlipidemia Mother    Multiple myeloma Mother    Heart attack Father    Dementia Father     SOCIAL HISTORY: Social History   Socioeconomic History   Marital status: Married    Spouse name: Dominica Severin   Number of children: 1   Years of education: Not on file   Highest education level: Bachelor's degree (e.g., BA, AB, BS)   Occupational History    Comment: retired Marine scientist  Tobacco Use   Smoking status: Never   Smokeless tobacco: Never  Substance and Sexual Activity   Alcohol use: Not Currently   Drug use: Never   Sexual activity: Not on file  Other Topics Concern   Not on file  Social History Narrative   Lives with husband   Social Determinants of Health   Financial Resource Strain: Not on file  Food Insecurity: Not on file  Transportation Needs: Not on file  Physical Activity: Not on file  Stress: Not on file  Social Connections: Not on file  Intimate Partner Violence: Not on file     PHYSICAL EXAM  GENERAL EXAM/CONSTITUTIONAL: Vitals:  Vitals:   06/27/22 1525 06/27/22 1528  BP: (!) 155/92 (!) 162/94  Pulse: 71 75  Weight: 135 lb (61.2 kg)   Height: 5\' 6"  (1.676 m)    Body mass index is 21.79 kg/m. Wt Readings from Last 3 Encounters:  06/27/22 135 lb (61.2 kg)  06/14/21 123 lb 9.6 oz (56.1 kg)  05/07/19 124 lb 9.6 oz (56.5 kg)   Patient is in no distress; well developed, nourished and groomed; slightly decr om in neck; neg lhermitte's  CARDIOVASCULAR: Examination of carotid arteries is normal; no carotid bruits Regular rate and rhythm, no murmurs Examination of peripheral vascular system by observation and palpation is normal  EYES: Ophthalmoscopic exam of optic discs and posterior segments is normal; no papilledema or hemorrhages No results found.  MUSCULOSKELETAL: Gait, strength, tone, movements noted in Neurologic exam below  NEUROLOGIC: MENTAL STATUS:      No data to display         awake, alert, oriented to person, place and time recent and remote memory intact normal attention and concentration language fluent, comprehension intact, naming intact fund of knowledge appropriate  CRANIAL NERVE:  2nd - no papilledema on fundoscopic exam 2nd, 3rd, 4th, 6th - pupils equal and reactive to light, visual fields full to confrontation, extraocular muscles intact, no  nystagmus 5th - facial sensation symmetric 7th - facial strength symmetric 8th - hearing intact 9th - palate elevates symmetrically, uvula midline 11th - shoulder shrug symmetric 12th - tongue protrusion midline  MOTOR:  normal bulk and tone, full strength in the BUE, BLE; EXCEPT DECR IN LUE (4+) AND LLE (4+)  SENSORY:  normal and symmetric to light touch, temperature, vibration  COORDINATION:  finger-nose-finger, fine finger movements normal  REFLEXES:  deep tendon reflexes present and symmetric  GAIT/STATION:  narrow based gait     DIAGNOSTIC DATA (LABS, IMAGING, TESTING) - I reviewed patient records, labs, notes, testing and imaging myself where available.  No results found for: "WBC", "HGB", "HCT", "MCV", "PLT"    Component Value  Date/Time   NA 143 07/23/2018 1122   K 5.2 07/23/2018 1122   CL 107 (H) 07/23/2018 1122   CO2 21 07/23/2018 1122   GLUCOSE 93 07/23/2018 1122   BUN 16 07/23/2018 1122   CREATININE 0.90 07/23/2018 1122   CALCIUM 9.9 07/23/2018 1122   GFRNONAA 66 07/23/2018 1122   GFRAA 76 07/23/2018 1122   Lab Results  Component Value Date   CHOL 180 04/15/2019   HDL 67 04/15/2019   LDLCALC 94 04/15/2019   TRIG 107 04/15/2019   CHOLHDL 2.7 04/15/2019   No results found for: "HGBA1C" No results found for: "VITAMINB12" No results found for: "TSH"   06/16/21 MRI cervical spine  - MRI scan cervical spine with and without contrast showing prominent spondylitic changes and C5-6 resulting in 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 C4-5 also shows moderate left and mild right-sided foraminal and canal narrowing.    ASSESSMENT AND PLAN  72 y.o. year old female here with:  Dx:  1. Neck pain     PLAN:  LEFT NECK PAIN (cervical radiculopathy) - refer to pain mgmt [facility]) - stop gabapentin --> not effective - prednisone pack for pain  MULTIPLE SCLEROSIS (dx'd 1985; previously on betaseron and copaxone) - consider  restarting disease modifying therapy; if patient agrees then would consider MRI brain and lab testing before restarting therapy; patient wants to hold off  Orders Placed This Encounter  Procedures   Ambulatory referral to Neurosurgery   Meds ordered this encounter  Medications   predniSONE (DELTASONE) 10 MG tablet    Sig: Take 60mg  on day 1. Reduce by 10mg  each subsequent day. (60, 50, 40, 30, 20, 10, stop)    Dispense:  21 tablet    Refill:  0   Return for return to PCP, pending if symptoms worsen or fail to improve.    Penni Bombard, MD 0/01/2329, 0:76 PM Certified in Neurology, Neurophysiology and Neuroimaging  [facility] [address] [place], [place] [address] [phone]

## 2022-07-06 DIAGNOSIS — H33101 Unspecified retinoschisis, right eye: Secondary | ICD-10-CM | POA: Diagnosis not present

## 2022-07-06 DIAGNOSIS — H3581 Retinal edema: Secondary | ICD-10-CM | POA: Diagnosis not present

## 2022-07-06 DIAGNOSIS — H43811 Vitreous degeneration, right eye: Secondary | ICD-10-CM | POA: Diagnosis not present

## 2022-07-12 DIAGNOSIS — M5412 Radiculopathy, cervical region: Secondary | ICD-10-CM | POA: Diagnosis not present

## 2022-07-12 DIAGNOSIS — M542 Cervicalgia: Secondary | ICD-10-CM | POA: Diagnosis not present

## 2022-08-01 DIAGNOSIS — M5412 Radiculopathy, cervical region: Secondary | ICD-10-CM | POA: Diagnosis not present

## 2022-08-24 DIAGNOSIS — M542 Cervicalgia: Secondary | ICD-10-CM | POA: Diagnosis not present

## 2022-08-24 DIAGNOSIS — M5136 Other intervertebral disc degeneration, lumbar region: Secondary | ICD-10-CM | POA: Diagnosis not present

## 2022-08-24 DIAGNOSIS — M5412 Radiculopathy, cervical region: Secondary | ICD-10-CM | POA: Diagnosis not present

## 2022-09-05 DIAGNOSIS — M5416 Radiculopathy, lumbar region: Secondary | ICD-10-CM | POA: Diagnosis not present

## 2022-11-07 ENCOUNTER — Other Ambulatory Visit: Payer: Self-pay | Admitting: Diagnostic Neuroimaging

## 2022-11-08 ENCOUNTER — Ambulatory Visit: Payer: Medicare HMO | Admitting: Diagnostic Neuroimaging

## 2022-12-19 DIAGNOSIS — E785 Hyperlipidemia, unspecified: Secondary | ICD-10-CM | POA: Diagnosis not present

## 2022-12-19 DIAGNOSIS — R7989 Other specified abnormal findings of blood chemistry: Secondary | ICD-10-CM | POA: Diagnosis not present

## 2022-12-19 DIAGNOSIS — E559 Vitamin D deficiency, unspecified: Secondary | ICD-10-CM | POA: Diagnosis not present

## 2022-12-19 DIAGNOSIS — I2510 Atherosclerotic heart disease of native coronary artery without angina pectoris: Secondary | ICD-10-CM | POA: Diagnosis not present

## 2022-12-26 DIAGNOSIS — M542 Cervicalgia: Secondary | ICD-10-CM | POA: Diagnosis not present

## 2022-12-26 DIAGNOSIS — G35 Multiple sclerosis: Secondary | ICD-10-CM | POA: Diagnosis not present

## 2022-12-26 DIAGNOSIS — Z1339 Encounter for screening examination for other mental health and behavioral disorders: Secondary | ICD-10-CM | POA: Diagnosis not present

## 2022-12-26 DIAGNOSIS — F5104 Psychophysiologic insomnia: Secondary | ICD-10-CM | POA: Diagnosis not present

## 2022-12-26 DIAGNOSIS — Z0000 Encounter for general adult medical examination without abnormal findings: Secondary | ICD-10-CM | POA: Diagnosis not present

## 2022-12-26 DIAGNOSIS — I499 Cardiac arrhythmia, unspecified: Secondary | ICD-10-CM | POA: Diagnosis not present

## 2022-12-26 DIAGNOSIS — E785 Hyperlipidemia, unspecified: Secondary | ICD-10-CM | POA: Diagnosis not present

## 2022-12-26 DIAGNOSIS — Z1331 Encounter for screening for depression: Secondary | ICD-10-CM | POA: Diagnosis not present
